# Patient Record
Sex: Male | Born: 1960 | Race: Black or African American | Hispanic: No | Marital: Married | Smoking: Never smoker
Health system: Southern US, Community
[De-identification: ages and names within clinical notes are randomized; demographics above are authoritative.]

## PROBLEM LIST (undated history)

## (undated) DIAGNOSIS — E538 Deficiency of other specified B group vitamins: Secondary | ICD-10-CM

## (undated) DIAGNOSIS — K219 Gastro-esophageal reflux disease without esophagitis: Secondary | ICD-10-CM

## (undated) DIAGNOSIS — J45909 Unspecified asthma, uncomplicated: Secondary | ICD-10-CM

## (undated) DIAGNOSIS — I10 Essential (primary) hypertension: Secondary | ICD-10-CM

## (undated) HISTORY — DX: Unspecified asthma, uncomplicated: J45.909

## (undated) HISTORY — PX: THROAT SURGERY: SHX803

## (undated) HISTORY — DX: Deficiency of other specified B group vitamins: E53.8

## (undated) HISTORY — DX: Gastro-esophageal reflux disease without esophagitis: K21.9

## (undated) HISTORY — DX: Essential (primary) hypertension: I10

---

## 2003-06-23 HISTORY — PX: OTHER SURGICAL HISTORY: SHX169

## 2008-02-28 ENCOUNTER — Emergency Department (HOSPITAL_COMMUNITY): Admission: EM | Admit: 2008-02-28 | Discharge: 2008-02-28 | Payer: Self-pay | Admitting: Emergency Medicine

## 2010-04-14 ENCOUNTER — Encounter: Payer: Self-pay | Admitting: Internal Medicine

## 2010-04-14 ENCOUNTER — Encounter: Admission: RE | Admit: 2010-04-14 | Discharge: 2010-04-14 | Payer: Self-pay | Admitting: Family Medicine

## 2010-05-21 ENCOUNTER — Ambulatory Visit: Payer: Self-pay | Admitting: Internal Medicine

## 2010-05-21 DIAGNOSIS — J4530 Mild persistent asthma, uncomplicated: Secondary | ICD-10-CM | POA: Insufficient documentation

## 2010-05-21 DIAGNOSIS — R05 Cough: Secondary | ICD-10-CM

## 2010-07-03 ENCOUNTER — Ambulatory Visit
Admission: RE | Admit: 2010-07-03 | Discharge: 2010-07-03 | Payer: Self-pay | Source: Home / Self Care | Attending: Internal Medicine | Admitting: Internal Medicine

## 2010-07-03 ENCOUNTER — Encounter: Payer: Self-pay | Admitting: Internal Medicine

## 2010-07-22 NOTE — Assessment & Plan Note (Signed)
Summary: Pulmonary/ new pt eval with vcd ? asthma also    Visit Type:  Initial Consult Copy to:  Dr. Leilani Able Primary Provider/Referring Provider:  Dr. Leilani Able  CC:  Cough.  History of Present Illness: 50 yowm never smoker with new onset doe x 2006   May 21, 2010  1st pulmonary office eval cc doe indolent  onset sob/ dry cough   [place] from [place] moved to [place] 2007 assoc with cough and congestion worse when lie down.  presently doe x heavy exertion like jogging.   ventolin helps the most  mucus is clear   Pt denies any significant sore throat, dysphagia, itching, sneezing,  nasal congestion or excess secretions,  fever, chills, sweats, unintended wt loss, pleuritic or exertional cp, hempoptysis, change in activity tolerance  orthopnea pnd or leg swelling Pt also denies any obvious fluctuation in symptoms with weather or environmental change or other alleviating or aggravating factors.       Preventive Screening-Counseling & Management  Alcohol-Tobacco     Passive Smoke Exposure: yes  Current Medications (verified): 1)  Ventolin Hfa 108 (90 Base) Mcg/act Aers (Albuterol Sulfate) .... 2 Puffs Every 4-6 Hrs As Needed  Allergies (verified): No Known Drug Allergies  Past History:  Past Medical History: ? 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  The patient denies shortness of breath with activity, coughing up blood, chest pain, irregular heartbeats,  acid heartburn, indigestion, loss of appetite, weight change, abdominal pain, difficulty swallowing, sore throat, tooth/dental problems, headaches, nasal congestion/difficulty breathing through nose, sneezing, itching, ear ache, anxiety, depression, hand/feet swelling, joint stiffness or pain, rash, change in color of mucus, and fever.    Vital Signs:  Patient profile:   50 year old male Height:      71 inches Weight:      304 pounds BMI:     42.55 O2 Sat:      95 % on Room air Temp:     98.2 degrees F oral Pulse rate:   87 / minute BP sitting:   138 / 90  (left arm) Cuff size:   large  Vitals Entered By: Vernie Murders (May 21, 2010 10:20 AM)  O2 Flow:  Room air  Physical Exam  Additional Exam:  wt 304 May 21, 2010 amb obese bm with classic pseudowheeze resolves with purse lip maneuver and mod hoarseness HEENT: nl dentition, turbinates, and orophanx. Nl external ear canals without cough reflex NECK :  without JVD/Nodes/TM/ nl carotid upstrokes bilaterally LUNGS: no acc muscle use, clear to A and P bilaterally without cough on insp or exp maneuvers CV:  RRR  no s3 or murmur or increase in P2, no edema  ABD:  soft and nontender with nl excursion in the supine position. No bruits or organomegaly, bowel sounds nl MS:  warm without deformities, calf tenderness, cyanosis or clubbing SKIN: warm and dry without lesions   NEURO:  alert, approp, no deficits     CXR  Procedure date:  04/14/2010  Findings:      No active disease   Impression & Recommendations:  Problem # 1:  COUGH (ICD-786.2)  Most c/w  Classic Upper airway cough syndrome, so named because it's frequently impossible to sort out how much is  CR/sinusitis with freq throat clearing (which can be related to primary GERD)   vs  causing  secondary extra esophageal GERD from wide swings in gastric pressure that occur with throat clearing, promoting self use of mint and menthol lozenges that reduce the lower  esophageal sphincter tone and exacerbate the problem further These are the same pts who not infrequently have failed to tolerate ace inhibitors,  dry powder inhalers or biphosphonates or report having reflux symptoms that don't respond to standard doses of PPI  For now try max gerd rx plus diet with full pft's plus f/v loop to r/o a mechanical upper airway problem (scarring from prev surgery)  on return     Problem # 2:  ASTHMA, UNSPECIFIED (ICD-493.90)   DDX of  difficult airways managment all start with A and  include Adherence, Ace Inhibitors, Acid Reflux, Active Sinus Disease, Alpha 1 Antitripsin deficiency, Anxiety masquerading as Airways dz,  ABPA,  allergy(esp in young), Aspiration (esp in elderly), Adverse effects of DPI,  Active smokers, plus one B  = Beta blocker use.Marland Kitchen   Unifying dx here is Acid reflux, see #1 but saba response does suggest this is at least partly an asthma issue.  In this case Adherence is the biggest issue and starts with  inability to use HFA effectively and also  understand that SABA treats the symptoms but doesn't get to the underlying problem (inflammation).  I used  the ananology of putting steroid cream on a rash to help explain the meaning of topical therapy and the need to get the drug to the target tissue.  Remains to be seen how much primary airways dz remains after correct the upper airway problem  Medications Added to Medication List This Visit: 1)  Nexium 40 Mg Cpdr (Esomeprazole magnesium) .... Take  one 30-60 min before first meal 2)  Pepcid 20 Mg Tabs (Famotidine) .... Take one by mouth at bedtime 3)  Dulera 100-5 Mcg/act Aero (Mometasone furo-formoterol fum) .... 2 puffs every 12 hours as needed for coughing or short of breath or wheezing 4)  Ventolin Hfa 108 (90 Base) Mcg/act Aers (Albuterol sulfate) .... 2 puffs every 4-6 hrs as needed 5)  Ventolin Hfa 108 (90 Base) Mcg/act Aers (Albuterol sulfate) .... 2 puffs every 4-6 hrs as needed for  emergencies  Other Orders: Consultation Level V (16109)  Patient Instructions: 1)  Please schedule a follow-up appointment in 4weeks, sooner if needed with PFT's  -first week in January is ok 2)  Dulera 100 2 puffs every 12 hours as needed 3)  start nexium 40 mg Take  one 30-60 min before first meal  and pepcid 20 mg at bedtime 4)  GERD (REFLUX)  is a common cause of respiratory symptoms. It commonly presents without heartburn and can be treated with medication, but also with lifestyle changes including avoidance of late meals, excessive alcohol, smoking cessation, and avoid fatty foods, chocolate, peppermint, colas, red wine, and acidic juices such as orange juice. NO MINT OR MENTHOL PRODUCTS SO NO COUGH DROPS  5)  USE SUGARLESS CANDY INSTEAD (jolley ranchers)  6)  NO OIL BASED VITAMINS  Prescriptions: NEXIUM 40 MG  CPDR (ESOMEPRAZOLE MAGNESIUM) Take  one 30-60 min before  first meal  #34 x 3   Entered and Authorized by:   Nyoka Cowden MD   Signed by:   Nyoka Cowden MD on 05/21/2010   Method used:   Electronically to        [facility] [phone]* (retail)       [address]       [place], [place]  [address]       Ph: [phone]       Fax: [phone]   RxID:   [id number] PEPCID 20 MG TABS (FAMOTIDINE) take one by mouth at bedtime  #34 x 3   Entered and Authorized by:   Nyoka Cowden MD   Signed by:   Nyoka Cowden MD on 05/21/2010   Method used:   Electronically to        [facility] [phone]* (retail)       [address]       [place], [place]  [address]       Ph: [phone]       Fax: [phone]   RxID:   [id number]

## 2010-07-24 NOTE — Assessment & Plan Note (Signed)
Summary: Pulmnonary/ f/u ov dx = asthma, hfa 90% effective   Copy to:  Dr. Leilani Able Primary Provider/Referring Provider:  Dr. Leilani Able  CC:  Rov after PFT.  History of Present Illness: 71 yowm never smoker with new onset doe x 2006   May 21, 2010  1st pulmonary office eval cc doe indolent  onset sob/ dry cough   [place] 2007 assoc with cough and congestion worse when lie down.  presently doe x heavy exertion like jogging.   ventolin helps the most  mucus is clear. rec Please schedule a follow-up appointment in 4weeks, sooner if needed with PFT's  -first week in January is ok Dulera 100 2 puffs every 12 hours as needed > lost it  start nexium 40 mg Take  one 30-60 min before first meal  and pepcid 20 mg at bedtime GERD (REFLUX)   diet   July 03, 2010 ov with pft's.with marked airflow  obstruction > normalizes p saba but pt having virtually no symptoms so rarely uses saba and no longer has dulera. no cough. Pt denies any significant sore throat, dysphagia, itching, sneezing,  nasal congestion or excess secretions,  fever, chills, sweats, unintended wt loss, pleuritic or exertional cp, hempoptysis, change in activity tolerance  orthopnea pnd or leg swelling Pt also denies any obvious fluctuation in symptoms with weather or environmental change or other alleviating or aggravating factors.     no nocturnal symptoms or noct need for saba.    Preventive Screening-Counseling & Management  Alcohol-Tobacco     Smoking Status: never     Passive Smoke Exposure: yes  Current Medications (verified): 1)  Nexium 40 Mg  Cpdr (Esomeprazole Magnesium) .... ***not Taking***take  One 30-60 Min Before First Meal 2)  Pepcid 20 Mg Tabs (Famotidine) .... Take One By Mouth At Bedtime 3)  Dulera 100-5 Mcg/act Aero (Mometasone Furo-Formoterol Fum) .... ***not Taking***2 Puffs Every 12 Hours As Needed For Coughing or Short of Breath or  Wheezing 4)  Ventolin Hfa 108 (90 Base) Mcg/act Aers (Albuterol Sulfate) .... ***not Taking***2 Puffs Every 4-6 Hrs As Needed For Emergencies 5)  Prilosec Otc 20 Mg Tbec (Omeprazole Magnesium) .Marland Kitchen.. 1 Tab By Mouth 30 Minutes Before First Meal of The Day  Allergies (verified): No Known Drug Allergies  Past History:  Past Medical History:   Ashtma........................................Marland KitchenWert       - hfa 75% May 21, 2010 > 90% July 03, 2010       - PFT's FEV1 1.86 (50%) ratop 57 > nl p B2 (68% better) wotj DLCO nl  Social History: Smoking Status:  never  Vital Signs:  Patient profile:   50 year old male Height:      71 inches Weight:      298 pounds BMI:     41.71 O2 Sat:      97 % on Room air Temp:     98.3 degrees F oral Pulse rate:   79 / minute BP sitting:   100 / 70  (left arm) Cuff size:   large  Vitals Entered By: Zackery Barefoot CMA (July 03, 2010 10:11 AM)  O2 Flow:  Room air CC: Rov after PFT Comments Medications reviewed with patient Verified contact number and pharmacy with patient Zackery Barefoot [facility]  July 03, 2010 10:12 AM    Physical Exam  Additional Exam:  wt 304 May 21, 2010 > 298 July 03, 2010  amb obese bm with classic pseudowheeze resolves with purse lip maneuver and mod hoarseness HEENT: nl dentition, turbinates, and orophanx. Nl external ear canals without cough reflex NECK :  without JVD/Nodes/TM/ nl carotid upstrokes bilaterally LUNGS: no acc muscle use, clear to A and P bilaterally without cough on insp or exp maneuvers CV:  RRR  no s3 or murmur or increase in P2, no edema  ABD:  soft and nontender with nl excursion in the supine position. No bruits or organomegaly, bowel sounds nl MS:  warm without deformities, calf tenderness, cyanosis or clubbing SKIN: warm and dry without lesions       Impression & Recommendations:  Problem # 1:  ASTHMA, UNSPECIFIED (ICD-493.90) I had an extended discussion with the patient today  lasting 15 to 20 minutes of a 25 minute visit on the following issues:  He has much more asthma than subjective symptoms suggest and really needs to be on a maint rx for at least 3 months before consider step down rx.  I spent extra time with the patient today explaining optimal mdi  technique.  This improved from  75-90% p coaching   Each maintenance medication was reviewed in detail including most importantly the difference between maintenance and as needed and under what circumstances the prns are to be used. See instructions for specific recommendations   Problem # 2:  COUGH (ICD-786.2)  resolved on rx for gerd > continue am dosing only of ppi plus diet, also reviewed again  Orders: Est. Patient Level IV (04540)  Medications Added to Medication List This Visit: 1)  Nexium 40 Mg Cpdr (Esomeprazole magnesium) .... ***not taking***take  one 30-60 min before first meal 2)  Dulera 100-5 Mcg/act Aero (Mometasone furo-formoterol fum) .... ***not taking***2 puffs every 12 hours as needed for coughing or short of breath or wheezing 3)  Dulera 100-5 Mcg/act Aero (Mometasone furo-formoterol fum) .... 2 puffs first thing  in am and 2 puffs again in pm about 12 hours later 4)  Ventolin Hfa 108 (90 Base) Mcg/act Aers (Albuterol sulfate) .... ***not taking***2 puffs every 4-6 hrs as needed for emergencies 5)  Prilosec Otc 20 Mg Tbec (Omeprazole magnesium) .Marland Kitchen.. 1 tab by mouth 30 minutes before first meal of the day 6)  Proair Hfa 108 (90 Base) Mcg/act Aers (Albuterol sulfate) .Marland Kitchen.. 1-2 puffs every 4-6 hours as needed  Patient Instructions: 1)  Stay on prilosec 20 mg Take  one 30-60 min before first meal of the day 2)  dulera 100 2 puffs first thing  in am and 2 puffs again in pm about 12 hours later  3)  Return to office in 3 months, sooner if needed  Prescriptions: PROAIR HFA 108 (90 BASE) MCG/ACT  AERS (ALBUTEROL SULFATE) 1-2 puffs every 4-6 hours as needed  #1 x 1   Entered and Authorized by:    Nyoka Cowden MD   Signed by:   Nyoka Cowden MD on 07/03/2010   Method used:   Print then Give to Patient   RxID:   [id number] DULERA 100-5 MCG/ACT AERO (MOMETASONE FURO-FORMOTEROL FUM) 2 puffs first thing  in am and 2 puffs again in pm about 12 hours later  #1 x 11   Entered and Authorized by:   Nyoka Cowden MD   Signed by:   Nyoka Cowden MD on 07/03/2010   Method used:   Print then Give to Patient   RxID:   [id number]

## 2010-07-24 NOTE — Miscellaneous (Signed)
Summary: Orders Update pft charges  Clinical Lists Changes  Orders: Added new Service order of Carbon Monoxide diffusing w/capacity (94720) - Signed Added new Service order of Lung Volumes (94240) - Signed Added new Service order of Spirometry (Pre & Post) (94060) - Signed 

## 2010-10-15 ENCOUNTER — Other Ambulatory Visit: Payer: Self-pay | Admitting: *Deleted

## 2010-10-15 MED ORDER — ESOMEPRAZOLE MAGNESIUM 40 MG PO CPDR: 40.0000 mg | DELAYED_RELEASE_CAPSULE | Freq: Every day | ORAL | Status: DC

## 2011-08-05 ENCOUNTER — Other Ambulatory Visit: Payer: Self-pay | Admitting: Internal Medicine

## 2011-08-27 ENCOUNTER — Telehealth: Payer: Self-pay | Admitting: Internal Medicine

## 2011-08-27 MED ORDER — MOMETASONE FURO-FORMOTEROL FUM 100-5 MCG/ACT IN AERO: 2.0000 | INHALATION_SPRAY | Freq: Two times a day (BID) | RESPIRATORY_TRACT | Status: DC

## 2011-08-27 NOTE — Telephone Encounter (Signed)
Contacted patient. Notified pt that he had not been seen since 06/2010 and needed an appt. I scheduled pt an appt with MW and sent in 1 refill. Also notified him to keep his upcoming appt if he wants more refills.

## 2011-08-28 ENCOUNTER — Telehealth: Payer: Self-pay | Admitting: Internal Medicine

## 2011-08-28 NOTE — Telephone Encounter (Signed)
Spoke with pt pharmacy told him its non formulary for him ,  Pt states he's had it filled before. He going to contact  His insurance company and see why they are not wanting To cover it. Has an appt with you this month will leave him a  Sample of the dulera 100 mcg pt to pick up today .

## 2011-09-08 ENCOUNTER — Ambulatory Visit: Payer: Self-pay | Admitting: Internal Medicine

## 2011-09-25 ENCOUNTER — Encounter: Payer: Self-pay | Admitting: Pulmonary Disease

## 2011-09-28 ENCOUNTER — Encounter: Payer: Self-pay | Admitting: Internal Medicine

## 2011-09-28 ENCOUNTER — Ambulatory Visit (INDEPENDENT_AMBULATORY_CARE_PROVIDER_SITE_OTHER): Payer: BC Managed Care – PPO | Admitting: Internal Medicine

## 2011-09-28 VITALS — BP 136/82 | HR 63 | Temp 98.3°F | Ht 70.0 in | Wt 307.0 lb

## 2011-09-28 DIAGNOSIS — J45909 Unspecified asthma, uncomplicated: Secondary | ICD-10-CM

## 2011-09-28 MED ORDER — ESOMEPRAZOLE MAGNESIUM 40 MG PO CPDR: 40.0000 mg | DELAYED_RELEASE_CAPSULE | Freq: Every day | ORAL | Status: DC

## 2011-09-28 MED ORDER — FAMOTIDINE 20 MG PO TABS: ORAL_TABLET | ORAL | Status: AC

## 2011-09-28 NOTE — Progress Notes (Signed)
Subjective:     Patient ID: Christopher Steele, male   DOB: [birth year]    MRN: [id number]  HPI 33 yowm never smoker with new onset doe x 2006   May 21, 2010 1st pulmonary office eval cc doe indolent onset sob/ dry cough [place] assoc with cough and congestion worse when lie down. presently doe x heavy exertion like jogging. ventolin helps the most mucus is clear  Rec Please schedule a follow-up appointment in 4weeks, sooner if needed with PFT's -first week in January is ok  2) Dulera 100 2 puffs every 12 hours as needed  3) start nexium 40 mg Take one 30-60 min before first meal and pepcid 20 mg at bedtime  4) GERD diet    09/28/2011 f/u ov/Caroly Purewal ran out of dulera x one month, prior to that doing great despite only using the dulera maybe once or twice daily, and not needing saba much at all daytime. Admits not adhering to diet or gerd rx either and more sob vs p last ov but no excess cough/ sputum production or overt sinus or reflux symptoms  Sleeping ok without nocturnal  or early am exacerbation  of respiratory  c/o's or need for noct saba. Also denies any obvious fluctuation of symptoms with weather or environmental changes or other aggravating or alleviating factors except as outlined above   ROS  At present neg for  any significant sore throat, dysphagia, dental problems, itching, sneezing,  nasal congestion or excess/ purulent secretions, ear ache,   fever, chills, sweats, unintended wt loss, pleuritic or exertional cp, hemoptysis, palpitations, orthopnea pnd or leg swelling.  Also denies presyncope, palpitations, heartburn, abdominal pain, anorexia, nausea, vomiting, diarrhea  or change in bowel or urinary habits, change in stools or urine, dysuria,hematuria,  rash, arthralgias, visual complaints, headache, numbness weakness or ataxia or problems with walking or coordination. No noted change in mood/affect or memory.                      Past Medical History:  ? 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 Nl external ear canals without cough reflex  NECK : without JVD/Nodes/TM/ nl carotid upstrokes bilaterally  LUNGS: no acc muscle use, clear to A and P bilaterally without cough on insp or exp maneuvers  CV: RRR no s3 or murmur or increase in P2, no edema  ABD: soft and nontender with nl excursion in the supine position. No bruits or organomegaly, bowel sounds nl  MS: warm without deformities, calf tenderness, cyanosis or clubbing  SKIN: warm and dry without lesions  NEURO: alert, approp, no deficits Assessment:        Plan:

## 2011-09-28 NOTE — Patient Instructions (Signed)
Stay on nexium 40mg  x one pill 30 min before your first meal and pecid 20 mg one at bedtime until return  GERD (REFLUX)  is an extremely common cause of respiratory symptoms just like yours, many times with no significant heartburn at all.    It can be treated with medication, but also with lifestyle changes including avoidance of late meals, excessive alcohol, smoking cessation, and avoid fatty foods, chocolate, peppermint, colas, red wine, and acidic juices such as orange juice.  NO MINT OR MENTHOL PRODUCTS SO NO COUGH DROPS  USE SUGARLESS CANDY INSTEAD (jolley ranchers or Stover's)  NO OIL BASED VITAMINS - use powdered substitutes.  Only use dulera 100 2 puffs every 12 hours if you're still having breathing problem   Please schedule a follow up office visit in 4 weeks, sooner if needed with PFT's

## 2011-10-01 NOTE — Assessment & Plan Note (Signed)
Not clear how much of this is truly asthma vs  Classic Upper airway cough syndrome, so named because it's frequently impossible to sort out how much is  CR/sinusitis with freq throat clearing (which can be related to primary GERD)   vs  causing  secondary (" extra esophageal")  GERD from wide swings in gastric pressure that occur with throat clearing, often  promoting self use of mint and menthol lozenges that reduce the lower esophageal sphincter tone and exacerbate the problem further in a cyclical fashion.   These are the same pts (now being labeled as having "irritable larynx syndrome" by some cough centers) who not infrequently have a history of having failed to tolerate ace inhibitors,  dry powder inhalers or biphosphonates or report having atypical reflux symptoms that don't respond to standard doses of PPI , and are easily confused as having aecopd or asthma flares by even experienced allergists/ pulmonologists.   For now would like him to max rx with gerd and only use dulera if / when he still has symptoms and return for pft's to sort out various sources of sob, much of which may be directly attributable to wt gain/ gerd.   The proper method of use, as well as anticipated side effects, of a metered-dose inhaler are discussed and demonstrated to the patient. Improved effectiveness after extensive coaching during this visit to a level of approximately  75%

## 2011-10-28 ENCOUNTER — Ambulatory Visit: Payer: BC Managed Care – PPO | Admitting: Internal Medicine

## 2011-11-27 ENCOUNTER — Telehealth: Payer: Self-pay | Admitting: Internal Medicine

## 2011-11-27 MED ORDER — MOMETASONE FURO-FORMOTEROL FUM 100-5 MCG/ACT IN AERO: 2.0000 | INHALATION_SPRAY | Freq: Two times a day (BID) | RESPIRATORY_TRACT | Status: DC

## 2011-11-27 NOTE — Telephone Encounter (Signed)
Spoke with pt's spouse. She states that the pt is needing a refill on dulera. I advised will refill x 1 only, but he needs to schedule rov with PFT's, he cancelled the last appt. She states he is out of town due to an emergency and when he returns she will have him call for appt. Dulera refilled, nothing further needed.

## 2011-12-04 ENCOUNTER — Ambulatory Visit: Payer: BC Managed Care – PPO | Admitting: Internal Medicine

## 2012-07-10 ENCOUNTER — Other Ambulatory Visit: Payer: Self-pay | Admitting: Internal Medicine

## 2012-09-02 ENCOUNTER — Other Ambulatory Visit: Payer: Self-pay | Admitting: Internal Medicine

## 2012-09-03 ENCOUNTER — Telehealth: Payer: Self-pay | Admitting: Pulmonary Disease

## 2012-09-03 NOTE — Telephone Encounter (Signed)
Patient called wanting a refill on his inhaler that has expired.  Has not followed up with [place] compliantly on 2 different occasions, despite having apptm 4 weeks both times.  Has not been seen in almost a year.  Having issues with cough and some increased sob.  I have informed pt that we cannot fill his inhaler due to noncompliance with visits and [place] not participating in his care, and will need to re-establish with [place].  Recommended that he go to urgent care if he is sick and having breathing issues. To call the office Monday for apptm.

## 2012-09-05 ENCOUNTER — Emergency Department (INDEPENDENT_AMBULATORY_CARE_PROVIDER_SITE_OTHER)
Admission: EM | Admit: 2012-09-05 | Discharge: 2012-09-05 | Disposition: A | Discharge status: Home / Self Care | Payer: BC Managed Care – PPO | Source: Home / Self Care | Attending: Emergency Medicine | Admitting: Emergency Medicine

## 2012-09-05 ENCOUNTER — Encounter (HOSPITAL_COMMUNITY): Payer: Self-pay | Admitting: Emergency Medicine

## 2012-09-05 DIAGNOSIS — J45909 Unspecified asthma, uncomplicated: Secondary | ICD-10-CM

## 2012-09-05 MED ORDER — ALBUTEROL SULFATE HFA 108 (90 BASE) MCG/ACT IN AERS: 2.0000 | INHALATION_SPRAY | RESPIRATORY_TRACT | Status: DC | PRN

## 2012-09-05 MED ORDER — ESOMEPRAZOLE MAGNESIUM 40 MG PO CPDR: 40.0000 mg | DELAYED_RELEASE_CAPSULE | Freq: Every day | ORAL | Status: DC

## 2012-09-05 MED ORDER — MOMETASONE FURO-FORMOTEROL FUM 100-5 MCG/ACT IN AERO: 2.0000 | INHALATION_SPRAY | Freq: Two times a day (BID) | RESPIRATORY_TRACT | Status: DC

## 2012-09-05 NOTE — ED Notes (Addendum)
Pt is here needing refill on his medications Had SOB earlier this am but it has resovled Denies any new medical problems  He is alert and oriented w/no signs of acute respiratory distress.

## 2012-09-05 NOTE — ED Provider Notes (Signed)
History     CSN: [id number]  Arrival date & time 09/05/12  1120   First MD Initiated Contact with Patient 09/05/12 1127      No chief complaint on file.   (Consider location/radiation/quality/duration/timing/severity/associated sxs/prior treatment) Patient is a 52 y.o. male presenting with cough. The history is provided by the patient. No language interpreter was used.  Cough Cough characteristics:  Non-productive Severity:  Mild Pt has a history of asthma.   Pt reports he is out of dulera, nexium and albuterol  Past Medical History  Diagnosis Date  . Asthma     hfa 75% May 21, 2010 > 90% 07/03/10; PFT's FET1 1.86 (50%) ratop 57 > nl p B2 ( 68% better) wotj DLCO nl    Past Surgical History  Procedure Laterality Date  . Polyp on vocal cord removed  2005    Family History  Problem Relation Age of Onset  . Asthma Mother   . Heart disease Father     History  Substance Use Topics  . Smoking status: Never Smoker   . Smokeless tobacco: Not on file  . Alcohol Use: Not on file      Review of Systems  Respiratory: Positive for cough.   All other systems reviewed and are negative.    Allergies  Review of patient's allergies indicates no known allergies.  Home Medications   Current Outpatient Rx  Name  Route  Sig  Dispense  Refill  . albuterol (PROVENTIL HFA;VENTOLIN HFA) 108 (90 BASE) MCG/ACT inhaler   Inhalation   Inhale 2 puffs into the lungs every 4 (four) hours as needed.         Marland Kitchen esomeprazole (NEXIUM) 40 MG capsule   Oral   Take 1 capsule (40 mg total) by mouth daily.   30 capsule   11   . famotidine (PEPCID) 20 MG tablet      One at bedtime   30 tablet   11   . mometasone-formoterol (DULERA) 100-5 MCG/ACT AERO   Inhalation   Inhale 2 puffs into the lungs 2 (two) times daily.   1 Inhaler   0     Needs appt for refills     There were no vitals taken for this visit.  Physical Exam  Nursing note and vitals reviewed. Constitutional:  He appears well-developed and well-nourished.  HENT:  Head: Normocephalic.  Cardiovascular: Normal rate and normal heart sounds.   Pulmonary/Chest: Effort normal.  Musculoskeletal: Normal range of motion.  Neurological: He is alert.  Psychiatric: He has a normal mood and affect.    ED Course  Procedures (including critical care time)  Labs Reviewed - No data to display No results found.   1. Asthma   2. Unspecified asthma       MDM  Pt given rx for dulera, nexium and albuterol       Elson Areas, PA-C 09/05/12 [address] [place], [place] 09/05/12 1333

## 2012-09-05 NOTE — ED Provider Notes (Signed)
Medical screening examination/treatment/procedure(s) were performed by non-physician practitioner and as supervising physician I was immediately available for consultation/collaboration.  Desean Heemstra, M.D.  Yosgar Demirjian C Jesica Goheen, MD 09/05/12 2107 

## 2012-11-04 ENCOUNTER — Ambulatory Visit: Payer: BC Managed Care – PPO | Admitting: Internal Medicine

## 2012-11-30 ENCOUNTER — Ambulatory Visit: Payer: BC Managed Care – PPO | Admitting: Internal Medicine

## 2013-05-13 ENCOUNTER — Encounter (HOSPITAL_COMMUNITY): Payer: Self-pay | Admitting: Emergency Medicine

## 2013-05-13 ENCOUNTER — Emergency Department (INDEPENDENT_AMBULATORY_CARE_PROVIDER_SITE_OTHER)
Admission: EM | Admit: 2013-05-13 | Discharge: 2013-05-13 | Disposition: A | Discharge status: Home / Self Care | Payer: BC Managed Care – PPO | Source: Home / Self Care | Attending: Family Medicine | Admitting: Family Medicine

## 2013-05-13 DIAGNOSIS — J45901 Unspecified asthma with (acute) exacerbation: Secondary | ICD-10-CM

## 2013-05-13 DIAGNOSIS — J45909 Unspecified asthma, uncomplicated: Secondary | ICD-10-CM

## 2013-05-13 MED ORDER — PREDNISONE 50 MG PO TABS: 50.0000 mg | ORAL_TABLET | Freq: Every day | ORAL | Status: DC

## 2013-05-13 MED ORDER — ALBUTEROL SULFATE (5 MG/ML) 0.5% IN NEBU
INHALATION_SOLUTION | RESPIRATORY_TRACT | Status: AC
  Filled 2013-05-13: qty 1

## 2013-05-13 MED ORDER — ALBUTEROL SULFATE HFA 108 (90 BASE) MCG/ACT IN AERS: 2.0000 | INHALATION_SPRAY | RESPIRATORY_TRACT | Status: DC | PRN

## 2013-05-13 MED ORDER — MOMETASONE FURO-FORMOTEROL FUM 100-5 MCG/ACT IN AERO: 2.0000 | INHALATION_SPRAY | Freq: Two times a day (BID) | RESPIRATORY_TRACT | Status: DC

## 2013-05-13 MED ORDER — IPRATROPIUM BROMIDE 0.02 % IN SOLN
0.5000 mg | Freq: Once | RESPIRATORY_TRACT | Status: AC
  Administered 2013-05-13: 0.5 mg via RESPIRATORY_TRACT

## 2013-05-13 MED ORDER — ESOMEPRAZOLE MAGNESIUM 40 MG PO CPDR: 40.0000 mg | DELAYED_RELEASE_CAPSULE | Freq: Every day | ORAL | Status: DC

## 2013-05-13 MED ORDER — ALBUTEROL SULFATE (5 MG/ML) 0.5% IN NEBU
5.0000 mg | INHALATION_SOLUTION | Freq: Once | RESPIRATORY_TRACT | Status: AC
  Administered 2013-05-13: 5 mg via RESPIRATORY_TRACT

## 2013-05-13 MED ORDER — SODIUM CHLORIDE 0.9 % IN NEBU
INHALATION_SOLUTION | RESPIRATORY_TRACT | Status: AC
  Filled 2013-05-13: qty 3

## 2013-05-13 MED ORDER — IPRATROPIUM BROMIDE 0.02 % IN SOLN
RESPIRATORY_TRACT | Status: AC
  Filled 2013-05-13: qty 2.5

## 2013-05-13 NOTE — ED Provider Notes (Signed)
Seon Gaertner is a 52 y.o. male who presents to Urgent Care today for wheezing and shortness of breath. Patient has a history of asthma. This was previously controlled with Dulera and intermittent albuterol. He has been out of these medications for about one month. Several days ago he noted increased productive cough and wheezing. He denies any chest pains or palpitations nausea vomiting or diarrhea. He would like his medications refilled and will followup with his primary care doctor soon.   Past Medical History  Diagnosis Date  . Asthma     hfa 75% May 21, 2010 > 90% 07/03/10; PFT's FET1 1.86 (50%) ratop 57 > nl p B2 ( 68% better) wotj DLCO nl   History  Substance Use Topics  . Smoking status: Never Smoker   . Smokeless tobacco: Not on file  . Alcohol Use: Yes   ROS as above Medications reviewed. No current facility-administered medications for this encounter.   Current Outpatient Prescriptions  Medication Sig Dispense Refill  . albuterol (PROVENTIL HFA;VENTOLIN HFA) 108 (90 BASE) MCG/ACT inhaler Inhale 2 puffs into the lungs every 4 (four) hours as needed.  1 Inhaler  1  . esomeprazole (NEXIUM) 40 MG capsule Take 1 capsule (40 mg total) by mouth daily.  30 capsule  1  . mometasone-formoterol (DULERA) 100-5 MCG/ACT AERO Inhale 2 puffs into the lungs 2 (two) times daily.  1 Inhaler  1  . predniSONE (DELTASONE) 50 MG tablet Take 1 tablet (50 mg total) by mouth daily.  5 tablet  0    Exam:  BP 125/75  Pulse 82  Temp(Src) 98.2 F (36.8 C) (Oral)  Resp 18  SpO2 100% Gen: Well NAD, obese HEENT: EOMI,  MMM Lungs: Normal work of breathing. Wheezing present left lung with expiration Heart: RRR no MRG Abd: NABS, Soft. NT, ND Exts: Non edematous BL  LE, warm and well perfused.   Patient was given DuoNeb nebulizer treatment and had considerable improvement in symptoms.   Assessment and Plan: 52 y.o. male with asthma exacerbation. Currently well controlled with nebulizer  treatment. We'll treat with albuterol, and inhaled corticosteroid, prednisone, and Nexium. Followup with primary care provider or pulmonologist. Discussed warning signs or symptoms. Please see discharge instructions. Patient expresses understanding.      Rodolph Bong, MD 05/13/13 [phone]

## 2013-05-13 NOTE — ED Notes (Signed)
Pt here for refill on Medications. He needs his inhaler and dulera. Does feel SOB. Pt is alert and oriented.

## 2015-04-24 ENCOUNTER — Emergency Department (HOSPITAL_COMMUNITY): Payer: BLUE CROSS/BLUE SHIELD

## 2015-04-24 ENCOUNTER — Encounter (HOSPITAL_COMMUNITY): Payer: Self-pay | Admitting: Emergency Medicine

## 2015-04-24 ENCOUNTER — Emergency Department (HOSPITAL_COMMUNITY)
Admission: EM | Admit: 2015-04-24 | Discharge: 2015-04-24 | Disposition: A | Discharge status: Home / Self Care | Payer: BLUE CROSS/BLUE SHIELD | Attending: Emergency Medicine | Admitting: Emergency Medicine

## 2015-04-24 DIAGNOSIS — Z79899 Other long term (current) drug therapy: Secondary | ICD-10-CM | POA: Insufficient documentation

## 2015-04-24 DIAGNOSIS — J45901 Unspecified asthma with (acute) exacerbation: Secondary | ICD-10-CM | POA: Insufficient documentation

## 2015-04-24 DIAGNOSIS — R0602 Shortness of breath: Secondary | ICD-10-CM | POA: Diagnosis present

## 2015-04-24 LAB — CBC
HCT: 39.3 % (ref 39.0–52.0)
Hemoglobin: 12.8 g/dL — ABNORMAL LOW (ref 13.0–17.0)
MCH: 31.1 pg (ref 26.0–34.0)
MCHC: 32.6 g/dL (ref 30.0–36.0)
MCV: 95.6 fL (ref 78.0–100.0)
PLATELETS: 247 10*3/uL (ref 150–400)
RBC: 4.11 MIL/uL — ABNORMAL LOW (ref 4.22–5.81)
RDW: 12.2 % (ref 11.5–15.5)
WBC: 6.2 10*3/uL (ref 4.0–10.5)

## 2015-04-24 LAB — BASIC METABOLIC PANEL
Anion gap: 13 (ref 5–15)
BUN: 15 mg/dL (ref 6–20)
CALCIUM: 9.1 mg/dL (ref 8.9–10.3)
CO2: 25 mmol/L (ref 22–32)
CREATININE: 1.34 mg/dL — AB (ref 0.61–1.24)
Chloride: 103 mmol/L (ref 101–111)
GFR calc Af Amer: >60 mL/min (ref >60)
GFR, EST NON AFRICAN AMERICAN: 59 mL/min — AB (ref >60)
GLUCOSE: 150 mg/dL — AB (ref 65–99)
Potassium: 3.8 mmol/L (ref 3.5–5.1)
SODIUM: 141 mmol/L (ref 135–145)

## 2015-04-24 LAB — I-STAT TROPONIN, ED: Troponin i, poc: 0.01 ng/mL (ref 0.00–0.08)

## 2015-04-24 MED ORDER — ALBUTEROL SULFATE HFA 108 (90 BASE) MCG/ACT IN AERS
2.0000 | INHALATION_SPRAY | RESPIRATORY_TRACT | Status: DC | PRN
  Administered 2015-04-24: 2 via RESPIRATORY_TRACT

## 2015-04-24 MED ORDER — ALBUTEROL SULFATE HFA 108 (90 BASE) MCG/ACT IN AERS
INHALATION_SPRAY | RESPIRATORY_TRACT | Status: AC
  Filled 2015-04-24: qty 6.7

## 2015-04-24 MED ORDER — PREDNISONE 50 MG PO TABS: 50.0000 mg | ORAL_TABLET | Freq: Every day | ORAL | Status: DC

## 2015-04-24 MED ORDER — ALBUTEROL SULFATE (2.5 MG/3ML) 0.083% IN NEBU
5.0000 mg | INHALATION_SOLUTION | Freq: Once | RESPIRATORY_TRACT | Status: AC
  Administered 2015-04-24: 5 mg via RESPIRATORY_TRACT
  Filled 2015-04-24: qty 6

## 2015-04-24 MED ORDER — PREDNISONE 20 MG PO TABS
60.0000 mg | ORAL_TABLET | Freq: Once | ORAL | Status: AC
  Administered 2015-04-24: 60 mg via ORAL
  Filled 2015-04-24: qty 3

## 2015-04-24 NOTE — ED Notes (Signed)
Patient verbalized understanding of D/C instructions and prescription medication. Denies any further questions at this time.

## 2015-04-24 NOTE — ED Notes (Signed)
MD at bedside. 

## 2015-04-24 NOTE — ED Notes (Signed)
Patient transported to X-ray 

## 2015-04-24 NOTE — ED Notes (Signed)
Patient c/o increased trouble breathing, wheezing, and cough.  Patient has hx of asthma and has had cough for 2 days.  Patient also reports chest tightness, but denies pain.  States he works 3rd shift and started the wheezing this morning while at work.  Patient A&O x 4.

## 2015-04-24 NOTE — Discharge Instructions (Signed)
Return to the ED with any concerns including difficulty breathing despite using albuterol every 4 hours, not drinking fluids, decreased urine output, vomiting and not able to keep down liquids or medications, decreased level of alertness/lethargy, or any other alarming symptoms    Emergency Department Resource Guide 1) Find a Doctor and Pay Out of Pocket Although you won't have to find out who is covered by your insurance plan, it is a good idea to ask around and get recommendations. You will then need to call the office and see if the doctor you have chosen will accept you as a new patient and what types of options they offer for patients who are self-pay. Some doctors offer discounts or will set up payment plans for their patients who do not have insurance, but you will need to ask so you aren't surprised when you get to your appointment.  2) Contact Your Local Health Department Not all health departments have doctors that can see patients for sick visits, but many do, so it is worth a call to see if yours does. If you don't know where your local health department is, you can check in your phone book. The CDC also has a tool to help you locate your state's health department, and many state websites also have listings of all of their local health departments.  3) Find a [facility] If your illness is not likely to be very severe or complicated, you may want to try a walk in clinic. These are popping up all over the country in pharmacies, drugstores, and shopping centers. They're usually staffed by nurse practitioners or physician assistants that have been trained to treat common illnesses and complaints. They're usually fairly quick and inexpensive. However, if you have serious medical issues or chronic medical problems, these are probably not your best option.  No Primary Care Doctor: - Call Health Connect at  [phone] - they can help you locate a primary care doctor that  accepts your insurance,  provides certain services, etc. - Physician Referral Service- [phone]  Chronic Pain Problems: Organization         Address  Phone   Notes  [facility]  [phone] Patients need to be referred by their primary care doctor.   Medication Assistance: Organization         Address  Phone   Notes  [facility] Medication [facility] [address] [place], [place] [address] [phone] --Must be a resident of [facility] -- Must have NO insurance coverage whatsoever (no Medicaid/ Medicare, etc.) -- The pt. MUST have a primary care doctor that directs their care regularly and follows them in the community   Med[facility]ist  [phone]   [employer]  [phone]    Agencies that provide inexpensive medical care: Organization         Address  Phone   Notes  [place]  [phone]   [facility]    [phone]   [facility] [place], [place] [address] [phone]   [place] [address] [place] [phone]   Planned Parenthood    [phone]   [facility]    [phone]   [place] and [place] [address], [place] Phone:  [phone], Fax:  [phone] Hours of Operation:  9 am - 6 pm, M-F.  Also accepts  Medicaid/Medicare and self-pay.  [facility] for [place] [address], [address], [place] Phone: [phone], Fax: [phone]. Hours of Operation:  8:30 am - 5:30 pm, M-F.  Also accepts Medicaid and self-pay.  [facility] [address], [place] Phone: [phone]   [place], [place], [place] [phone], Ext. [phone] Mondays & Thursdays: 7-9 AM.  First 15 patients are seen on a first come, first serve basis.    [place] Providers:  Organization         Address  Phone    Notes  [facility] [address], [place] [phone] Also accepts self-pay patients.  [facility] [address], [place]  [phone]   [place], [address], [place] [phone]   [facility] [address], [place] [phone]   Lucianne Lei [address], [place]   [phone] Only accepts [place] Access [place] patients after they have their name applied to their card.   Self-Pay (no insurance) in [facility]:  Organization         Address  Phone   Notes  Sickle Cell Patients, [facility] [place] [phone]   [facility] [place] [phone]   [facility] [place]  [place], [place], [place] [phone]   [facility]/Dr. Osei-Bonsu  [address] [place] or [address], [place] [phone] Phone number for both [place] and [place] locations is the same.  Urgent Medical and [facility] [address], [place] [phone]   [facility] [address], [place] or [address] [phone]   [facility] [address] [place] [phone], phone; [phone], fax Sees patients 1st and 3rd Saturday of every month.  Must not qualify for public or private insurance (i.e. Medicaid, Medicare, [place] Health Choice, Veterans' Benefits)  Household income should be no more than 200% of the poverty level The clinic cannot treat you if you are pregnant or think you are pregnant  Sexually transmitted diseases are not treated at the clinic.    Dental Care: Organization         Address  Phone  Notes  [facility] Department of [facility] [phone] Accepts children up to age 28 who are enrolled in [place] or [place]; pregnant women with a Medicaid card; and children who have applied for Medicaid or [place] Health Choice, but were declined, whose parents can pay a reduced fee at time of service.  [facility] Department of [facility]  [address], [place] [phone] Accepts children up to age 30 who are enrolled in [place] or [place]; pregnant women with a Medicaid card; and children who have applied for Medicaid or [place] Health Choice, but were declined, whose parents can pay a reduced fee at time of service.  [place] Adult [facility] Access PROGRAM  [place] [phone] Patients are seen by appointment only. Walk-ins are not accepted. [place] will see patients 57 years of age and older. Monday - Tuesday (8am-5pm) Most  Wednesdays (8:30-5pm) $30 per visit, cash only  [facility] Adult Dental Access PROGRAM  [address], [facility] [phone] Patients are seen by appointment only. Walk-ins are not accepted. [place] will see patients 71 years of age and older. One Wednesday Evening (Monthly: Volunteer Based).  $30 per visit, cash only  [place]  [phone] for adults; Children under age 88, call Graduate Pediatric Dentistry at [phone]. Children aged 35-14, please call [phone] to request a pediatric application.  Dental services are provided in all areas of dental care including fillings, crowns and bridges, complete and partial dentures, implants, gum treatment, root canals, and extractions. Preventive care is also provided. Treatment is provided to both adults and children. Patients are selected via a lottery and there is often a waiting list.   [facility] [address] [place]  [phone] [email or web address]   [facility] [address] [place], [place] [phone], Ext.  [phone] Second and Fourth Thursday of each month, opens at 6:30 AM; Clinic ends at 9 AM.  Patients are seen on a first-come first-served basis, and a limited number are seen during each clinic.   [facility]  [address] Hillard Danker [place], [place] [phone]   Eligibility Requirements You must have lived in [place], [place], or [place] counties for at least the last three months.   You cannot be eligible for state or federal sponsored [employer], including [employer], [place], or [employer].   You generally cannot be eligible for healthcare insurance through your employer.    How to apply: Eligibility screenings are held every Tuesday and Wednesday afternoon from 1:00 pm until 4:00 pm. You do not need an appointment for the interview!  [facility] [address] [place] [address], [place], [place]   [place]  [place] Department  [place]  [phone]    Behavioral Health Resources in the Community: Intensive Outpatient Programs Organization         Address  Phone  Notes  [place]. [address], [place], [place] [phone]   [facility] Outpatient [address], [place], [place]   ADS: Alcohol & Drug Svcs [address] [place], [place]   [place] [address],  [place], [place] or [phone]   Substance Abuse Resources Organization         Address  Phone  Notes  Alcohol and Drug Services  [phone]   [place]  [phone]   [facility] [place]   Chinita Pester  [phone]   Residential & Outpatient Substance Abuse Program  [phone]   Psychological Services Organization         Address  Phone  Notes  [facility] [place]  [place]  [phone]   [place] [address] [place] or [phone]    Mobile Crisis Teams Organization         Address  Phone  Notes  [facility], Mobile Crisis Care Unit  [phone]   [facility] Psychotherapeutic Services  [address] [place], [place]   [place] Levels [address] [place] [phone]    Self-Help/Support Groups Organization         Address  Phone  Notes  Mental Health Assoc. of [place] - variety of support groups  [place] Call for more information  Narcotics Anonymous (NA), [facility] [address], [employer] [place]  2 meetings at this location   Special educational needs teacher         Address  Phone  Notes  ASAP Residential Treatment [place],    [place]  [phone]   [facility]  [address] [place] [address], [place], [place]   [place], [place] [phone] Admissions: 8am-3pm M-F  Incentives Substance [place] [address],    [place], [place] [phone]   [facility] [address] [place], [place], [place]   [facility] [address],  [address], [place]   Insight Programs - Intensive Outpatient [address] Kristeen [address], [place], [place]   [facility] [facility] [address]  [place], [place] [phone] or [phone]   [facility] Treatment Services (RTS) [address] [place], [place] Accepts Medicaid  [facility] [place] [address],  [place] [phone] Substance Abuse/Addiction Treatment   [facility] Organization         Address  Phone  Notes  [facility]  [phone]   Domenic Schwab, PhD [address] [place], [place]   [phone] or [phone]   [place]  [place], [place] [phone]   [facility] [address] [place], [place] [phone] Insurance/Medicaid/sponsorship through [facility] and Families [address]                                    [place], [place] [phone] [place] [address], [place] [phone]    Dr. Adele Schilder  [phone]   [facility] Dept. 1) [address], [place] 2) [place] 3)  [place] [address], [place] [phone]  [phone]   [place] [phone] or [phone] (After Hours)

## 2015-04-24 NOTE — ED Provider Notes (Signed)
CSN: [id number]     Arrival date & time 04/24/15  0347 History   First MD Initiated Contact with Patient 04/24/15 0402     Chief Complaint  Patient presents with  . Shortness of Breath     (Consider location/radiation/quality/duration/timing/severity/associated sxs/prior Treatment) HPI  Pt with hx of asthma presenting with c/o wheezing and shortness of breath.  He states symptoms have been ongoing for the past several days.  He has been using albuterol- used it today approx 5-6 times.  No fever/chills.  Has had some nonproductive cough as well.  Pt states wheezing was getting worse while at work prior to coming to the ED.  No chest pain.  No leg swelling.  He denies hx of hospitalization or intubation for asthma.  No recent steroids.  There are no other associated systemic symptoms, there are no other alleviating or modifying factors.   Past Medical History  Diagnosis Date  . Asthma     hfa 75% May 21, 2010 > 90% 07/03/10; PFT's FET1 1.86 (50%) ratop 57 > nl p B2 ( 68% better) wotj DLCO nl   Past Surgical History  Procedure Laterality Date  . 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 Asthma Mother   . Heart disease Father    Social History  Substance Use Topics  . Smoking status: Never Smoker   . Smokeless tobacco: None  . Alcohol Use: Yes    Review of Systems  ROS reviewed and all otherwise negative except for mentioned in HPI    Allergies  Review of patient's allergies indicates no known allergies.  Home Medications   Prior to Admission medications   Medication Sig Start Date End Date Taking? Authorizing Provider  albuterol (PROVENTIL HFA;VENTOLIN HFA) 108 (90 BASE) MCG/ACT inhaler Inhale 2 puffs into the lungs every 4 (four) hours as needed. Patient taking differently: Inhale 2 puffs into the lungs every 4 (four) hours as needed for wheezing.  05/13/13  Yes Gregor Hams, MD  predniSONE (DELTASONE) 50 MG tablet Take 1  tablet (50 mg total) by mouth daily. 04/24/15   Alfonzo Beers, MD   BP 110/60 mmHg  Pulse 91  Temp(Src) 98.4 F (36.9 C) (Oral)  Resp 16  Ht 5\' 11"  (1.803 m)  Wt 317 lb 11.2 oz (144.108 kg)  BMI 44.33 kg/m2  SpO2 98%  Vitals reviewed Physical Exam  Physical Examination: General appearance - alert, well appearing, and in no distress Mental status - alert, oriented to person, place, and time Eyes - no conjunctival injection, no scleral icterus Mouth - mucous membranes moist, pharynx normal without lesions Chest - clear to auscultation, no wheezes, rales or rhonchi, symmetric air entry, normal respiratory effort Heart - normal rate, regular rhythm, normal S1, S2, no murmurs, rubs, clicks or gallops Abdomen - soft, nontender, nondistended, no masses or organomegaly Neurological - alert, oriented, normal speech Extremities - peripheral pulses normal, no pedal edema, no clubbing or cyanosis Skin - normal coloration and turgor, no rashes  ED Course  Procedures (including critical care time)  4:30 AM went to see patient, he was being taken to xray Labs Review Labs Reviewed  BASIC METABOLIC PANEL - Abnormal; Notable for the following:    Glucose, Bld 150 (*)    Creatinine, Ser 1.34 (*)    GFR calc non Af Amer 59 (*)    All other components within normal limits  CBC - Abnormal; Notable for the  following:    RBC 4.11 (*)    Hemoglobin 12.8 (*)    All other components within normal limits  Randolm Idol, ED    Imaging Review Dg Chest 2 View  04/24/2015  CLINICAL DATA:  Dyspnea, onset tonight. Improved with breathing treatment. EXAM: CHEST  2 VIEW COMPARISON:  04/14/2010 FINDINGS: The heart size and mediastinal contours are within normal limits. Both lungs are clear. The visualized skeletal structures are unremarkable. IMPRESSION: No active cardiopulmonary disease. Electronically Signed   By: Andreas Newport M.D.   On: 04/24/2015 04:56   I have personally reviewed and evaluated  these images and lab results as part of my medical decision-making.   EKG Interpretation None      MDM   Final diagnoses:  Asthma exacerbation    Pt presentign with wheezing and cough.  Symptoms are c/w prior asthma exacerbations.  He has been using albuterol frequently during the day today.  Pt feels much improved after albuterol neb in the ED.  CXR is reassuring.  His O2 sats are improved from time of arrival.  Pt given abluterol MDI as he has run out, also course of prednisone.  Discharged with strict return precautions.  Pt agreeable with plan.    Alfonzo Beers, MD 04/24/15 [phone]

## 2015-05-30 ENCOUNTER — Encounter (HOSPITAL_COMMUNITY): Payer: Self-pay | Admitting: Emergency Medicine

## 2015-05-30 ENCOUNTER — Emergency Department (INDEPENDENT_AMBULATORY_CARE_PROVIDER_SITE_OTHER)
Admission: EM | Admit: 2015-05-30 | Discharge: 2015-05-30 | Disposition: A | Discharge status: Home / Self Care | Payer: BLUE CROSS/BLUE SHIELD | Source: Home / Self Care | Attending: Family Medicine | Admitting: Family Medicine

## 2015-05-30 DIAGNOSIS — J3489 Other specified disorders of nose and nasal sinuses: Secondary | ICD-10-CM

## 2015-05-30 DIAGNOSIS — J069 Acute upper respiratory infection, unspecified: Secondary | ICD-10-CM | POA: Diagnosis not present

## 2015-05-30 DIAGNOSIS — J4541 Moderate persistent asthma with (acute) exacerbation: Secondary | ICD-10-CM

## 2015-05-30 MED ORDER — PREDNISONE 20 MG PO TABS: ORAL_TABLET | ORAL | Status: DC

## 2015-05-30 MED ORDER — ALBUTEROL SULFATE HFA 108 (90 BASE) MCG/ACT IN AERS: 2.0000 | INHALATION_SPRAY | RESPIRATORY_TRACT | Status: DC | PRN

## 2015-05-30 MED ORDER — IPRATROPIUM-ALBUTEROL 0.5-2.5 (3) MG/3ML IN SOLN
RESPIRATORY_TRACT | Status: AC
  Filled 2015-05-30: qty 3

## 2015-05-30 MED ORDER — ALBUTEROL SULFATE (2.5 MG/3ML) 0.083% IN NEBU
INHALATION_SOLUTION | RESPIRATORY_TRACT | Status: AC
  Filled 2015-05-30: qty 3

## 2015-05-30 MED ORDER — IPRATROPIUM-ALBUTEROL 0.5-2.5 (3) MG/3ML IN SOLN: 3.0000 mL | Freq: Once | RESPIRATORY_TRACT | Status: DC

## 2015-05-30 MED ORDER — ALBUTEROL SULFATE (2.5 MG/3ML) 0.083% IN NEBU: 2.5000 mg | INHALATION_SOLUTION | Freq: Once | RESPIRATORY_TRACT | Status: DC

## 2015-05-30 NOTE — Discharge Instructions (Signed)
Asthma, Acute Bronchospasm Use your albuterol nebulizer machine every 4-6 hours as needed for cough and wheezing. You have also been prescribed albuterol HFA inhaler to be used on the same schedule. Prednisone taper dose as directed. An histamines such as Claritin, Allegra or Zyrtec for drainage. Recommend seeing your PCP or pulmonologist, Dr. Melvyn Novas as soon as possible. It was strongly recommended that she receive a DuoNeb here in the office to help you breathe, limits her cough and opening her airways. By refusing this treatment it may take you longer to get better and he may get worse sooner. Acute bronchospasm caused by asthma is also referred to as an asthma attack. Bronchospasm means your air passages become narrowed. The narrowing is caused by inflammation and tightening of the muscles in the air tubes (bronchi) in your lungs. This can make it hard to breathe or cause you to wheeze and cough. CAUSES Possible triggers are:  Animal dander from the skin, hair, or feathers of animals.  Dust mites contained in house dust.  Cockroaches.  Pollen from trees or grass.  Mold.  Cigarette or tobacco smoke.  Air pollutants such as dust, household cleaners, hair sprays, aerosol sprays, paint fumes, strong chemicals, or strong odors.  Cold air or weather changes. Cold air may trigger inflammation. Winds increase molds and pollens in the air.  Strong emotions such as crying or laughing hard.  Stress.  Certain medicines such as aspirin or beta-blockers.  Sulfites in foods and drinks, such as dried fruits and wine.  Infections or inflammatory conditions, such as a flu, cold, or inflammation of the nasal membranes (rhinitis).  Gastroesophageal reflux disease (GERD). GERD is a condition where stomach acid backs up into your esophagus.  Exercise or strenuous activity. SIGNS AND SYMPTOMS   Wheezing.  Excessive coughing, particularly at night.  Chest tightness.  Shortness of  breath. DIAGNOSIS  Your health care provider will ask you about your medical history and perform a physical exam. A chest X-ray or blood testing may be performed to look for other causes of your symptoms or other conditions that may have triggered your asthma attack. TREATMENT  Treatment is aimed at reducing inflammation and opening up the airways in your lungs. Most asthma attacks are treated with inhaled medicines. These include quick relief or rescue medicines (such as bronchodilators) and controller medicines (such as inhaled corticosteroids). These medicines are sometimes given through an inhaler or a nebulizer. Systemic steroid medicine taken by mouth or given through an IV tube also can be used to reduce the inflammation when an attack is moderate or severe. Antibiotic medicines are only used if a bacterial infection is present.  HOME CARE INSTRUCTIONS   Rest.  Drink plenty of liquids. This helps the mucus to remain thin and be easily coughed up. Only use caffeine in moderation and do not use alcohol until you have recovered from your illness.  Do not smoke. Avoid being exposed to secondhand smoke.  You play a critical role in keeping yourself in good health. Avoid exposure to things that cause you to wheeze or to have breathing problems.  Keep your medicines up-to-date and available. Carefully follow your health care provider's treatment plan.  Take your medicine exactly as prescribed.  When pollen or pollution is bad, keep windows closed and use an air conditioner or go to places with air conditioning.  Asthma requires careful medical care. See your health care provider for a follow-up as advised. If you are more than [redacted] weeks pregnant and  you were prescribed any new medicines, let your obstetrician know about the visit and how you are doing. Follow up with your health care provider as directed.  After you have recovered from your asthma attack, make an appointment with your  outpatient doctor to talk about ways to reduce the likelihood of future attacks. If you do not have a doctor who manages your asthma, make an appointment with a primary care doctor to discuss your asthma. SEEK IMMEDIATE MEDICAL CARE IF:   You are getting worse.  You have trouble breathing. If severe, call your local emergency services (911 in the [place]  You develop chest pain or discomfort.  You are vomiting.  You are not able to keep fluids down.  You are coughing up yellow, green, brown, or bloody sputum.  You have a fever and your symptoms suddenly get worse.  You have trouble swallowing. MAKE SURE YOU:   Understand these instructions.  Will watch your condition.  Will get help right away if you are not doing well or get worse.   This information is not intended to replace advice given to you by your health care provider. Make sure you discuss any questions you have with your health care provider.   Document Released: 09/23/2006 Document Revised: 06/13/2013 Document Reviewed: 12/14/2012 Elsevier Interactive Patient Education 2016 [employer]  Asthma, Adult Asthma is a recurring condition in which the airways tighten and narrow. Asthma can make it difficult to breathe. It can cause coughing, wheezing, and shortness of breath. Asthma episodes, also called asthma attacks, range from minor to life-threatening. Asthma cannot be cured, but medicines and lifestyle changes can help control it. CAUSES Asthma is believed to be caused by inherited (genetic) and environmental factors, but its exact cause is unknown. Asthma may be triggered by allergens, lung infections, or irritants in the air. Asthma triggers are different for each person. Common triggers include:   Animal dander.  Dust mites.  Cockroaches.  Pollen from trees or grass.  Mold.  Smoke.  Air pollutants such as dust, household cleaners, hair sprays, aerosol sprays, paint fumes, strong chemicals, or strong  odors.  Cold air, weather changes, and winds (which increase molds and pollens in the air).  Strong emotional expressions such as crying or laughing hard.  Stress.  Certain medicines (such as aspirin) or types of drugs (such as beta-blockers).  Sulfites in foods and drinks. Foods and drinks that may contain sulfites include dried fruit, potato chips, and sparkling grape juice.  Infections or inflammatory conditions such as the flu, a cold, or an inflammation of the nasal membranes (rhinitis).  Gastroesophageal reflux disease (GERD).  Exercise or strenuous activity. SYMPTOMS Symptoms may occur immediately after asthma is triggered or many hours later. Symptoms include:  Wheezing.  Excessive nighttime or early morning coughing.  Frequent or severe coughing with a common cold.  Chest tightness.  Shortness of breath. DIAGNOSIS  The diagnosis of asthma is made by a review of your medical history and a physical exam. Tests may also be performed. These may include:  Lung function studies. These tests show how much air you breathe in and out.  Allergy tests.  Imaging tests such as X-rays. TREATMENT  Asthma cannot be cured, but it can usually be controlled. Treatment involves identifying and avoiding your asthma triggers. It also involves medicines. There are 2 classes of medicine used for asthma treatment:   Controller medicines. These prevent asthma symptoms from occurring. They are usually taken every day.  Reliever or  rescue medicines. These quickly relieve asthma symptoms. They are used as needed and provide short-term relief. Your health care provider will help you create an asthma action plan. An asthma action plan is a written plan for managing and treating your asthma attacks. It includes a list of your asthma triggers and how they may be avoided. It also includes information on when medicines should be taken and when their dosage should be changed. An action plan may also  involve the use of a device called a peak flow meter. A peak flow meter measures how well the lungs are working. It helps you monitor your condition. HOME CARE INSTRUCTIONS   Take medicines only as directed by your health care provider. Speak with your health care provider if you have questions about how or when to take the medicines.  Use a peak flow meter as directed by your health care provider. Record and keep track of readings.  Understand and use the action plan to help minimize or stop an asthma attack without needing to seek medical care.  Control your home environment in the following ways to help prevent asthma attacks:  Do not smoke. Avoid being exposed to secondhand smoke.  Change your heating and air conditioning filter regularly.  Limit your use of fireplaces and wood stoves.  Get rid of pests (such as roaches and mice) and their droppings.  Throw away plants if you see mold on them.  Clean your floors and dust regularly. Use unscented cleaning products.  Try to have someone else vacuum for you regularly. Stay out of rooms while they are being vacuumed and for a short while afterward. If you vacuum, use a dust mask from a hardware store, a double-layered or microfilter vacuum cleaner bag, or a vacuum cleaner with a HEPA filter.  Replace carpet with wood, tile, or vinyl flooring. Carpet can trap dander and dust.  Use allergy-proof pillows, mattress covers, and box spring covers.  Wash bed sheets and blankets every week in hot water and dry them in a dryer.  Use blankets that are made of polyester or cotton.  Clean bathrooms and kitchens with bleach. If possible, have someone repaint the walls in these rooms with mold-resistant paint. Keep out of the rooms that are being cleaned and painted.  Wash hands frequently. SEEK MEDICAL CARE IF:   You have wheezing, shortness of breath, or a cough even if taking medicine to prevent attacks.  The colored mucus you cough up  (sputum) is thicker than usual.  Your sputum changes from clear or white to yellow, green, gray, or bloody.  You have any problems that may be related to the medicines you are taking (such as a rash, itching, swelling, or trouble breathing).  You are using a reliever medicine more than 2-3 times per week.  Your peak flow is still at 50-79% of your personal best after following your action plan for 1 hour.  You have a fever. SEEK IMMEDIATE MEDICAL CARE IF:   You seem to be getting worse and are unresponsive to treatment during an asthma attack.  You are short of breath even at rest.  You get short of breath when doing very little physical activity.  You have difficulty eating, drinking, or talking due to asthma symptoms.  You develop chest pain.  You develop a fast heartbeat.  You have a bluish color to your lips or fingernails.  You are light-headed, dizzy, or faint.  Your peak flow is less than 50% of your  personal best.   This information is not intended to replace advice given to you by your health care provider. Make sure you discuss any questions you have with your health care provider.   Document Released: 06/08/2005 Document Revised: 02/27/2015 Document Reviewed: 01/05/2013 Elsevier Interactive Patient Education 2016 [employer].  How to Use an Inhaler Using your inhaler correctly is very important. Good technique will make sure that the medicine reaches your lungs.  HOW TO USE AN INHALER:  Take the cap off the inhaler.  If this is the first time using your inhaler, you need to prime it. Shake the inhaler for 5 seconds. Release four puffs into the air, away from your face. Ask your doctor for help if you have questions.  Shake the inhaler for 5 seconds.  Turn the inhaler so the bottle is above the mouthpiece.  Put your pointer finger on top of the bottle. Your thumb holds the bottom of the inhaler.  Open your mouth.  Either hold the inhaler away from your  mouth (the width of 2 fingers) or place your lips tightly around the mouthpiece. Ask your doctor which way to use your inhaler.  Breathe out as much air as possible.  Breathe in and push down on the bottle 1 time to release the medicine. You will feel the medicine go in your mouth and throat.  Continue to take a deep breath in very slowly. Try to fill your lungs.  After you have breathed in completely, hold your breath for 10 seconds. This will help the medicine to settle in your lungs. If you cannot hold your breath for 10 seconds, hold it for as long as you can before you breathe out.  Breathe out slowly, through pursed lips. Whistling is an example of pursed lips.  If your doctor has told you to take more than 1 puff, wait at least 15-30 seconds between puffs. This will help you get the best results from your medicine. Do not use the inhaler more than your doctor tells you to.  Put the cap back on the inhaler.  Follow the directions from your doctor or from the inhaler package about cleaning the inhaler. If you use more than one inhaler, ask your doctor which inhalers to use and what order to use them in. Ask your doctor to help you figure out when you will need to refill your inhaler.  If you use a steroid inhaler, always rinse your mouth with water after your last puff, gargle and spit out the water. Do not swallow the water. GET HELP IF:  The inhaler medicine only partially helps to stop wheezing or shortness of breath.  You are having trouble using your inhaler.  You have some increase in thick spit (phlegm). GET HELP RIGHT AWAY IF:  The inhaler medicine does not help your wheezing or shortness of breath or you have tightness in your chest.  You have dizziness, headaches, or fast heart rate.  You have chills, fever, or night sweats.  You have a large increase of thick spit, or your thick spit is bloody. MAKE SURE YOU:   Understand these instructions.  Will watch your  condition.  Will get help right away if you are not doing well or get worse.   This information is not intended to replace advice given to you by your health care provider. Make sure you discuss any questions you have with your health care provider.   Document Released: 03/17/2008 Document Revised: 03/29/2013 Document Reviewed:  01/05/2013 Elsevier Interactive Patient Education 2016 [employer].  Upper Respiratory Infection, Adult Most upper respiratory infections (URIs) are a viral infection of the air passages leading to the lungs. A URI affects the nose, throat, and upper air passages. The most common type of URI is nasopharyngitis and is typically referred to as "the common cold." URIs run their course and usually go away on their own. Most of the time, a URI does not require medical attention, but sometimes a bacterial infection in the upper airways can follow a viral infection. This is called a secondary infection. Sinus and middle ear infections are common types of secondary upper respiratory infections. Bacterial pneumonia can also complicate a URI. A URI can worsen asthma and chronic obstructive pulmonary disease (COPD). Sometimes, these complications can require emergency medical care and may be life threatening.  CAUSES Almost all URIs are caused by viruses. A virus is a type of germ and can spread from one person to another.  RISKS FACTORS You may be at risk for a URI if:   You smoke.   You have chronic heart or lung disease.  You have a weakened defense (immune) system.   You are very young or very old.   You have nasal allergies or asthma.  You work in crowded or poorly ventilated areas.  You work in health care facilities or schools. SIGNS AND SYMPTOMS  Symptoms typically develop 2-3 days after you come in contact with a cold virus. Most viral URIs last 7-10 days. However, viral URIs from the influenza virus (flu virus) can last 14-18 days and are typically more  severe. Symptoms may include:   Runny or stuffy (congested) nose.   Sneezing.   Cough.   Sore throat.   Headache.   Fatigue.   Fever.   Loss of appetite.   Pain in your forehead, behind your eyes, and over your cheekbones (sinus pain).  Muscle aches.  DIAGNOSIS  Your health care provider may diagnose a URI by:  Physical exam.  Tests to check that your symptoms are not due to another condition such as:  Strep throat.  Sinusitis.  Pneumonia.  Asthma. TREATMENT  A URI goes away on its own with time. It cannot be cured with medicines, but medicines may be prescribed or recommended to relieve symptoms. Medicines may help:  Reduce your fever.  Reduce your cough.  Relieve nasal congestion. HOME CARE INSTRUCTIONS   Take medicines only as directed by your health care provider.   Gargle warm saltwater or take cough drops to comfort your throat as directed by your health care provider.  Use a warm mist humidifier or inhale steam from a shower to increase air moisture. This may make it easier to breathe.  Drink enough fluid to keep your urine clear or pale yellow.   Eat soups and other clear broths and maintain good nutrition.   Rest as needed.   Return to work when your temperature has returned to normal or as your health care provider advises. You may need to stay home longer to avoid infecting others. You can also use a face mask and careful hand washing to prevent spread of the virus.  Increase the usage of your inhaler if you have asthma.   Do not use any tobacco products, including cigarettes, chewing tobacco, or electronic cigarettes. If you need help quitting, ask your health care provider. PREVENTION  The best way to protect yourself from getting a cold is to practice good hygiene.  Avoid oral or hand contact with people with cold symptoms.   Wash your hands often if contact occurs.  There is no clear evidence that vitamin C, vitamin  E, echinacea, or exercise reduces the chance of developing a cold. However, it is always recommended to get plenty of rest, exercise, and practice good nutrition.  SEEK MEDICAL CARE IF:   You are getting worse rather than better.   Your symptoms are not controlled by medicine.   You have chills.  You have worsening shortness of breath.  You have brown or red mucus.  You have yellow or brown nasal discharge.  You have pain in your face, especially when you bend forward.  You have a fever.  You have swollen neck glands.  You have pain while swallowing.  You have white areas in the back of your throat. SEEK IMMEDIATE MEDICAL CARE IF:   You have severe or persistent:  Headache.  Ear pain.  Sinus pain.  Chest pain.  You have chronic lung disease and any of the following:  Wheezing.  Prolonged cough.  Coughing up blood.  A change in your usual mucus.  You have a stiff neck.  You have changes in your:  Vision.  Hearing.  Thinking.  Mood. MAKE SURE YOU:   Understand these instructions.  Will watch your condition.  Will get help right away if you are not doing well or get worse.   This information is not intended to replace advice given to you by your health care provider. Make sure you discuss any questions you have with your health care provider.   Document Released: 12/02/2000 Document Revised: 10/23/2014 Document Reviewed: 09/13/2013 Elsevier Interactive Patient Education [employer].

## 2015-05-30 NOTE — ED Notes (Signed)
Patient refused medication, notified david mabe, np

## 2015-05-30 NOTE — ED Notes (Signed)
Reports symptoms for a week: runny nose, sinus drainage, cough, coughing up green/white phlegm, sore throat, and sob with coughing episodes.  Patient unsure about fever.

## 2015-05-30 NOTE — ED Provider Notes (Signed)
CSN: [id number]     Arrival date & time 05/30/15  1300 History   First MD Initiated Contact with Patient 05/30/15 1328     Chief Complaint  Patient presents with  . URI   (Consider location/radiation/quality/duration/timing/severity/associated sxs/prior Treatment) HPI Comments: 54 year old severely obese male complaining of shortness of breath and a cough for one week. Is also complaining of runny nose and sore throat. He denies PND or fever. Pt uncertain as to his past respiratory hx and diagnosis. He has a history of either reactive airway disease versus asthma. He was given a nebulizer treatment with albuterol at one point, approximately one year ago. He did not recognize that his symptoms may be due to wheezing.  Patient is a 54 y.o. male presenting with URI.  URI Presenting symptoms: congestion, cough, fever, rhinorrhea and sore throat   Presenting symptoms: no ear pain and no fatigue   Associated symptoms: no neck pain     Past Medical History  Diagnosis Date  . Asthma     hfa 75% May 21, 2010 > 90% 07/03/10; PFT's FET1 1.86 (50%) ratop 57 > nl p B2 ( 68% better) wotj DLCO nl  . Reflux    Past Surgical History  Procedure Laterality Date  . Polyp on vocal cord removed  2005  . Throat surgery      polyp   Family History  Problem Relation Age of Onset  . Asthma Mother   . Heart disease Father    Social History  Substance Use Topics  . Smoking status: Never Smoker   . Smokeless tobacco: None  . Alcohol Use: Yes    Review of Systems  Constitutional: Positive for fever and activity change. Negative for diaphoresis and fatigue.  HENT: Positive for congestion, rhinorrhea and sore throat. Negative for ear pain, facial swelling, postnasal drip and trouble swallowing.   Eyes: Negative for pain, discharge and redness.  Respiratory: Positive for cough and shortness of breath. Negative for chest tightness.   Cardiovascular: Negative.  Negative for chest pain.   Gastrointestinal: Negative.   Genitourinary: Negative.   Musculoskeletal: Negative.  Negative for neck pain and neck stiffness.  Neurological: Negative.   All other systems reviewed and are negative.   Allergies  Review of patient's allergies indicates no known allergies.  Home Medications   Prior to Admission medications   Medication Sig Start Date End Date Taking? Authorizing Provider  Dextromethorphan Polistirex (DELSYM PO) Take by mouth.   Yes Historical Provider, MD  dextromethorphan-guaiFENesin (MUCINEX DM) 30-600 MG 12hr tablet Take 1 tablet by mouth 2 (two) times daily.   Yes Historical Provider, MD  Esomeprazole Magnesium (NEXIUM PO) Take by mouth.   Yes Historical Provider, MD  albuterol (PROVENTIL HFA;VENTOLIN HFA) 108 (90 BASE) MCG/ACT inhaler Inhale 2 puffs into the lungs every 4 (four) hours as needed for wheezing or shortness of breath. 05/30/15   Janne Napoleon, NP  predniSONE (DELTASONE) 20 MG tablet 3 Tabs PO Days 1-3, then 2 tabs PO Days 4-6, then 1 tab PO Day 7-9, then Half Tab PO Day 10-12 05/30/15   Janne Napoleon, NP   Meds Ordered and Administered this Visit   Medications  ipratropium-albuterol (DUONEB) 0.5-2.5 (3) MG/3ML nebulizer solution 3 mL (not administered)  albuterol (PROVENTIL) (2.5 MG/3ML) 0.083% nebulizer solution 2.5 mg (not administered)    BP 139/87 mmHg  Pulse 80  Temp(Src) 98.4 F (36.9 C) (Oral)  Resp 22  SpO2 96% No data found.   Physical Exam  Constitutional: He  is oriented to person, place, and time. He appears well-developed and well-nourished. No distress.  HENT:  Mouth/Throat: No oropharyngeal exudate.  Bilateral TMs are normal Oropharynx with minor erythema, cobblestoning and clear PND  Eyes: Conjunctivae and EOM are normal.  Neck: Normal range of motion. Neck supple.  Cardiovascular: Normal rate, regular rhythm and normal heart sounds.   Pulmonary/Chest: Effort normal and breath sounds normal. No respiratory distress.  Expiratory  wheezes bilaterally. Prolonged expiratory phase. Fair air movement.  Musculoskeletal: Normal range of motion. He exhibits no edema.  Lymphadenopathy:    He has no cervical adenopathy.  Neurological: He is alert and oriented to person, place, and time.  Skin: Skin is warm and dry. No rash noted.  Psychiatric: He has a normal mood and affect.  Nursing note and vitals reviewed.   ED Course  Procedures (including critical care time)  Labs Review Labs Reviewed - No data to display  Imaging Review No results found.   Visual Acuity Review  Right Eye Distance:   Left Eye Distance:   Bilateral Distance:    Right Eye Near:   Left Eye Near:    Bilateral Near:         MDM   1. URI (upper respiratory infection)   2. Sinus drainage   3. Asthma exacerbation attacks, moderate persistent    For unknown reasons the patient refused to have his DuoNeb. He said something about taking too much time to do all of that stuff. The purpose of the DuoNeb was explained in detail. It was explained that his cough is primarily due to his severe bronchospasm as well as draining in the back of his throat that he does not perceive. He insists that he can do the same thing with his nebulizer at home and request another albuterol HFA and prednisone. He was also advised that he may not get better as quickly or as well as he would like by refusing this particular DuoNeb. Asthma, Acute Bronchospasm Use your albuterol nebulizer machine every 4-6 hours as needed for cough and wheezing. You have also been prescribed albuterol HFA inhaler to be used on the same schedule. Prednisone taper dose as directed. An histamines such as Claritin, Allegra or Zyrtec for drainage. Recommend seeing your PCP or pulmonologist, Dr. Melvyn Novas as soon as possible. It was strongly recommended that she receive a DuoNeb here in the office to help you breathe, limits her cough and opening her airways. By refusing this treatment it may take  you longer to get better and he may get worse sooner.    Janne Napoleon, NP 05/30/15 1409

## 2016-08-17 ENCOUNTER — Ambulatory Visit: Payer: BLUE CROSS/BLUE SHIELD | Admitting: Family Medicine

## 2016-08-18 ENCOUNTER — Ambulatory Visit (INDEPENDENT_AMBULATORY_CARE_PROVIDER_SITE_OTHER): Payer: BLUE CROSS/BLUE SHIELD | Admitting: Physician Assistant

## 2016-08-18 ENCOUNTER — Ambulatory Visit (INDEPENDENT_AMBULATORY_CARE_PROVIDER_SITE_OTHER): Payer: BLUE CROSS/BLUE SHIELD

## 2016-08-18 ENCOUNTER — Telehealth: Payer: Self-pay | Admitting: *Deleted

## 2016-08-18 ENCOUNTER — Ambulatory Visit (HOSPITAL_COMMUNITY)
Admission: RE | Admit: 2016-08-18 | Discharge: 2016-08-18 | Disposition: A | Discharge status: Home / Self Care | Payer: BLUE CROSS/BLUE SHIELD | Source: Ambulatory Visit | Attending: Physician Assistant | Admitting: Physician Assistant

## 2016-08-18 ENCOUNTER — Encounter: Payer: Self-pay | Admitting: Physician Assistant

## 2016-08-18 VITALS — BP 146/98 | HR 90 | Temp 98.6°F | Resp 20 | Ht 68.0 in | Wt 297.2 lb

## 2016-08-18 DIAGNOSIS — J4541 Moderate persistent asthma with (acute) exacerbation: Secondary | ICD-10-CM

## 2016-08-18 DIAGNOSIS — R0683 Snoring: Secondary | ICD-10-CM | POA: Diagnosis not present

## 2016-08-18 DIAGNOSIS — J181 Lobar pneumonia, unspecified organism: Secondary | ICD-10-CM | POA: Diagnosis not present

## 2016-08-18 DIAGNOSIS — J189 Pneumonia, unspecified organism: Secondary | ICD-10-CM

## 2016-08-18 LAB — COMPREHENSIVE METABOLIC PANEL
ALT: 14 U/L (ref 0–53)
AST: 13 U/L (ref 0–37)
Albumin: 4 g/dL (ref 3.5–5.2)
Alkaline Phosphatase: 50 U/L (ref 39–117)
BUN: 13 mg/dL (ref 6–23)
CHLORIDE: 103 meq/L (ref 96–112)
CO2: 33 meq/L — AB (ref 19–32)
Calcium: 9.3 mg/dL (ref 8.4–10.5)
Creatinine, Ser: 1.11 mg/dL (ref 0.40–1.50)
GFR: 88.29 mL/min (ref >60.00)
Glucose, Bld: 96 mg/dL (ref 70–99)
POTASSIUM: 4.4 meq/L (ref 3.5–5.1)
SODIUM: 140 meq/L (ref 135–145)
TOTAL PROTEIN: 7.5 g/dL (ref 6.0–8.3)
Total Bilirubin: 0.6 mg/dL (ref 0.2–1.2)

## 2016-08-18 LAB — CBC WITH DIFFERENTIAL/PLATELET
Basophils Absolute: 0.1 10*3/uL (ref 0.0–0.1)
Basophils Relative: 1.3 % (ref 0.0–3.0)
EOS PCT: 7 % — AB (ref 0.0–5.0)
Eosinophils Absolute: 0.4 10*3/uL (ref 0.0–0.7)
HCT: 35.8 % — ABNORMAL LOW (ref 39.0–52.0)
Hemoglobin: 11.8 g/dL — ABNORMAL LOW (ref 13.0–17.0)
Lymphocytes Relative: 26.5 % (ref 12.0–46.0)
Lymphs Abs: 1.4 10*3/uL (ref 0.7–4.0)
MCHC: 33 g/dL (ref 30.0–36.0)
MCV: 94.6 fl (ref 78.0–100.0)
Monocytes Absolute: 0.6 10*3/uL (ref 0.1–1.0)
Monocytes Relative: 10.4 % (ref 3.0–12.0)
Neutro Abs: 3 10*3/uL (ref 1.4–7.7)
Neutrophils Relative %: 54.8 % (ref 43.0–77.0)
Platelets: 317 10*3/uL (ref 150.0–400.0)
RBC: 3.79 Mil/uL — AB (ref 4.22–5.81)
RDW: 12.9 % (ref 11.5–15.5)
WBC: 5.4 10*3/uL (ref 4.0–10.5)

## 2016-08-18 MED ORDER — ESOMEPRAZOLE MAGNESIUM 40 MG PO CPDR: 40.0000 mg | DELAYED_RELEASE_CAPSULE | Freq: Every day | ORAL | 2 refills | Status: DC

## 2016-08-18 MED ORDER — LEVALBUTEROL HCL 1.25 MG/0.5ML IN NEBU
1.2500 mg | INHALATION_SOLUTION | Freq: Once | RESPIRATORY_TRACT | Status: AC
  Administered 2016-08-18: 1.25 mg via RESPIRATORY_TRACT

## 2016-08-18 MED ORDER — ALBUTEROL SULFATE (2.5 MG/3ML) 0.083% IN NEBU: 2.5000 mg | INHALATION_SOLUTION | Freq: Four times a day (QID) | RESPIRATORY_TRACT | 2 refills | Status: DC | PRN

## 2016-08-18 MED ORDER — ALBUTEROL SULFATE HFA 108 (90 BASE) MCG/ACT IN AERS: 1.0000 | INHALATION_SPRAY | Freq: Four times a day (QID) | RESPIRATORY_TRACT | 2 refills | Status: DC | PRN

## 2016-08-18 MED ORDER — MOMETASONE FURO-FORMOTEROL FUM 100-5 MCG/ACT IN AERO: 2.0000 | INHALATION_SPRAY | Freq: Two times a day (BID) | RESPIRATORY_TRACT | 2 refills | Status: DC | PRN

## 2016-08-18 MED ORDER — DOXYCYCLINE HYCLATE 100 MG PO TABS: 100.0000 mg | ORAL_TABLET | Freq: Two times a day (BID) | ORAL | 0 refills | Status: DC

## 2016-08-18 MED ORDER — BENZONATATE 200 MG PO CAPS: 200.0000 mg | ORAL_CAPSULE | Freq: Two times a day (BID) | ORAL | 0 refills | Status: DC | PRN

## 2016-08-18 NOTE — Progress Notes (Signed)
Subjective:    Patient ID: Christopher Steele, male    DOB: May 26, 1961, 56 y.o.   MRN: [id number]  HPI  Christopher Steele is a 56 y/o male who presents to clinic today to establish care.  Chronic SOB/cough: patient has been seen by Dr. Melvyn Novas, pulmonologist, in the past, last seen in 2013. He was diagnosed with asthma vs "classic upper airway cough syndrome" and was treated with Dulera, Albuterol, and Nexium and recommended a GERD diet. Per chart review, patient has been non-compliant with his care with the pulmonologist. He presents today with a 2-3 month history of cough. He has been seen in the urgent care at least 2-3 times since December. He has been on 3 rounds of antibiotics and 2 rounds of oral steroids. He has not had a chest xray. He has been using Proair inhaler and has also been using a nebulizer prn. He has not had any fevers x 1 month. He has had decreased appetite since his cough and has lost about 15 lb. He denies chest pain, lower leg swelling, headaches, vision changes. He does state that this wife has noticed that he has been snoring a lot and that he stops breathing when he is snoring, he states that he has never had a sleep study. He does not smoke.  Review of Systems  See HPI  Past Medical History:  Diagnosis Date  . Asthma    hfa 75% May 21, 2010 > 90% 07/03/10; PFT's FET1 1.86 (50%) ratop 57 > nl p B2 ( 68% better) wotj DLCO nl  . Reflux      Social History   Social History  . Marital status: Married    Spouse name: N/A  . Number of children: N/A  . Years of education: N/A   Occupational History  . Not on file.   Social History Main Topics  . Smoking status: Never Smoker  . Smokeless tobacco: Never Used  . Alcohol use Yes     Comment: rarely  . Drug use: No  . Sexual activity: No   Other Topics Concern  . Not on file   Social History Narrative  . No narrative on file    Past Surgical History:  Procedure Laterality Date  . polyp on vocal cord removed   2005  . THROAT SURGERY     polyp    Family History  Problem Relation Age of Onset  . Asthma Mother   . Heart disease Father     No Known Allergies  No current outpatient prescriptions on file prior to visit.   No current facility-administered medications on file prior to visit.     BP (!) 146/98 (BP Location: Left Arm, Patient Position: Sitting, Cuff Size: Large)   Pulse 90   Temp 98.6 F (37 C) (Oral)   Resp 20   Ht 5\' 8"  (1.727 m)   Wt 297 lb 4 oz (134.8 kg)   SpO2 94%   BMI 45.20 kg/m      Objective:   Physical Exam  Constitutional: He appears well-developed and well-nourished. He is cooperative.  Non-toxic appearance. He does not have a sickly appearance. He does not appear ill. No distress.  HENT:  Head: Normocephalic and atraumatic.  Right Ear: Tympanic membrane, external ear and ear canal normal. Tympanic membrane is not erythematous, not retracted and not bulging.  Left Ear: Tympanic membrane, external ear and ear canal normal. Tympanic membrane is not erythematous, not retracted and not bulging.  Nose: Nose normal.  Mouth/Throat: Uvula is midline and mucous membranes are normal. No posterior oropharyngeal edema or posterior oropharyngeal erythema.  Cardiovascular: Normal rate, regular rhythm, normal heart sounds and intact distal pulses.   No LE swelling  Pulmonary/Chest: Effort normal. No accessory muscle usage. No respiratory distress. He has no decreased breath sounds. He has wheezes (bilateral mild expiratory wheezes --> resolved completely after nebulizer treatment). He has no rhonchi. He has no rales.  Lymphadenopathy:    He has no cervical adenopathy.  Neurological: He is alert.  Skin: Skin is warm, dry and intact.  Nursing note and vitals reviewed.  CXR: possible early infiltrate in RLL vs atelectasis     Assessment & Plan:  1. Moderate persistent asthma with exacerbation Pulmonary appointment to re-establish care with Dr. Melvyn Novas is for March 20.  Labs pending. Re-start Dulera and Nexium. Discontinue Zantac as he is not finding this effective. Refilled albuterol inhaler and nebulizer solution.  - CBC with Differential/Platelet - Comprehensive metabolic panel - DG Chest 2 View; Future - levalbuterol (XOPENEX) nebulizer solution 1.25 mg; Take 1.25 mg by nebulization once.  2. Snoring Pulmonary appointment to re-establish care with Dr. Melvyn Novas is for March 20, discussed with patient that he should discuss being evaluated for sleep apnea.  3. Community acquired pneumonia of right lower lobe of lung ([place]) CXR shows possible infiltrate in RLL, discussed results with patient over the phone. Will start doxycycline. Tessalon perles for cough. Advised patient that if he develops worsening fevers or SOB, he needs to be re-evaluated. Patient verbalized understanding.  Additionally, patient was interested in meeting with Lucina Mellow, clinic RN manager to discuss Advanced Directives during today's visit.  [name] PA-C 08/18/16

## 2016-08-18 NOTE — Patient Instructions (Addendum)
It was great meeting you today! We will call you with your lab and chest xray results.  Thank your for scheduling an appointment with Dr. Melvyn Novas.  I have sent them a note that you also need to have a sleep study to evaluate whether or not you have sleep apnea.  Use the [facility] every 12 hours as needed. Discontinue the Zantac. Start the 40 mg Nexium, take one 30-60 minutes before your first meal.  I would like for you to follow-up with [place] in 2 weeks so we can re-check your blood pressure and perform a physical exam.     Cough, Adult Coughing is a reflex that clears your throat and your airways. Coughing helps to heal and protect your lungs. It is normal to cough occasionally, but a cough that happens with other symptoms or lasts a long time may be a sign of a condition that needs treatment. A cough may last only 2-3 weeks (acute), or it may last longer than 8 weeks (chronic). What are the causes? Coughing is commonly caused by:  Breathing in substances that irritate your lungs.  A viral or bacterial respiratory infection.  Allergies.  Asthma.  Postnasal drip.  Smoking.  Acid backing up from the stomach into the esophagus (gastroesophageal reflux).  Certain medicines.  Chronic lung problems, including COPD (or rarely, lung cancer).  Other medical conditions such as heart failure. Follow these instructions at home: Pay attention to any changes in your symptoms. Take these actions to help with your discomfort:  Take medicines only as told by your health care provider.  If you were prescribed an antibiotic medicine, take it as told by your health care provider. Do not stop taking the antibiotic even if you start to feel better.  Talk with your health care provider before you take a cough suppressant medicine.  Drink enough fluid to keep your urine clear or pale yellow.  If the air is dry, use a cold steam vaporizer or humidifier in your bedroom or your home to help loosen  secretions.  Avoid anything that causes you to cough at work or at home.  If your cough is worse at night, try sleeping in a semi-upright position.  Avoid cigarette smoke. If you smoke, quit smoking. If you need help quitting, ask your health care provider.  Avoid caffeine.  Avoid alcohol.  Rest as needed. Contact a health care provider if:  You have new symptoms.  You cough up pus.  Your cough does not get better after 2-3 weeks, or your cough gets worse.  You cannot control your cough with suppressant medicines and you are losing sleep.  You develop pain that is getting worse or pain that is not controlled with pain medicines.  You have a fever.  You have unexplained weight loss.  You have night sweats. Get help right away if:  You cough up blood.  You have difficulty breathing.  Your heartbeat is very fast. This information is not intended to replace advice given to you by your health care provider. Make sure you discuss any questions you have with your health care provider. Document Released: 12/05/2010 Document Revised: 11/14/2015 Document Reviewed: 08/15/2014 Elsevier Interactive Patient Education  2017 [employer].

## 2016-08-18 NOTE — Telephone Encounter (Signed)
Pt calling saying medication [name] ordered was too expensive: Nexium $260.00 and Dulera $80.00, ProAir $75.00. Pt said he will get Nexium OTC which is only $25.00 and he can not afford both Inhalers. Told pt to get [facility] that is the one she really wants him to take. Pt verbalized understanding. Samantha notified.

## 2016-08-18 NOTE — Progress Notes (Signed)
Pre visit review using our clinic review tool, if applicable. No additional management support is needed unless otherwise documented below in the visit note. 

## 2016-08-19 ENCOUNTER — Telehealth: Payer: Self-pay | Admitting: Physician Assistant

## 2016-08-19 ENCOUNTER — Other Ambulatory Visit (INDEPENDENT_AMBULATORY_CARE_PROVIDER_SITE_OTHER): Payer: BLUE CROSS/BLUE SHIELD

## 2016-08-19 ENCOUNTER — Other Ambulatory Visit: Payer: Self-pay

## 2016-08-19 DIAGNOSIS — D649 Anemia, unspecified: Secondary | ICD-10-CM

## 2016-08-19 DIAGNOSIS — J45901 Unspecified asthma with (acute) exacerbation: Principal | ICD-10-CM

## 2016-08-19 DIAGNOSIS — J441 Chronic obstructive pulmonary disease with (acute) exacerbation: Secondary | ICD-10-CM

## 2016-08-19 LAB — FERRITIN: Ferritin: 250.7 ng/mL (ref 22.0–322.0)

## 2016-08-19 LAB — IBC PANEL
IRON: 47 ug/dL (ref 42–165)
Saturation Ratios: 15.4 % — ABNORMAL LOW (ref 20.0–50.0)
Transferrin: 218 mg/dL (ref 212.0–360.0)

## 2016-08-19 LAB — IRON: IRON: 47 ug/dL (ref 42–165)

## 2016-08-19 NOTE — Telephone Encounter (Signed)
Patient does not want to take the esomeprazole (NEXIUM) 40 MG capsule. Patient wants to stay on ranitigine and needs a refill on that rx sent to:  [facility], [place] [phone] (Phone) [phone] (Fax)   Patient also stated that "someone" gave him a call. I looked at the last note and did not see who may have given him that call. Patient stated they did not leave a voicemail.   Please call patient to advise on all of the above

## 2016-08-20 MED ORDER — RANITIDINE HCL 300 MG PO TABS: 300.0000 mg | ORAL_TABLET | Freq: Every day | ORAL | 1 refills | Status: DC

## 2016-08-20 NOTE — Progress Notes (Signed)
Spent 28 minutes discussing the following issues regarding Advance Directives:   -Goals of care  -Code status: difference between a full code, limited code (MOST form), and DNR -Difference between hospice and palliative care -Living Will  -Reason and significance for power of attorney -Reviewed each page in Advance Directive packet and answered any questions by patient  Patient was alert and oriented x4 and appeared to have no cognitive impairments during visit. No paperwork was completed during visit.

## 2016-08-20 NOTE — Addendum Note (Signed)
Addended by: Marian Sorrow on: 08/20/2016 01:03 PM   Modules accepted: Orders

## 2016-08-20 NOTE — Telephone Encounter (Signed)
Discussed with [name], okay for pt to go back on Ranitidine 300 mg daily. Rx sent to pharmacy. See result notes.

## 2016-08-21 ENCOUNTER — Other Ambulatory Visit: Payer: Self-pay | Admitting: Physician Assistant

## 2016-08-21 ENCOUNTER — Encounter: Payer: Self-pay | Admitting: Gastroenterology

## 2016-08-21 DIAGNOSIS — Z1212 Encounter for screening for malignant neoplasm of rectum: Principal | ICD-10-CM

## 2016-08-21 DIAGNOSIS — Z1211 Encounter for screening for malignant neoplasm of colon: Secondary | ICD-10-CM

## 2016-08-21 DIAGNOSIS — D649 Anemia, unspecified: Secondary | ICD-10-CM

## 2016-09-01 ENCOUNTER — Ambulatory Visit: Payer: BLUE CROSS/BLUE SHIELD | Admitting: Physician Assistant

## 2016-09-02 ENCOUNTER — Ambulatory Visit (INDEPENDENT_AMBULATORY_CARE_PROVIDER_SITE_OTHER): Payer: BLUE CROSS/BLUE SHIELD | Admitting: Physician Assistant

## 2016-09-02 ENCOUNTER — Encounter: Payer: Self-pay | Admitting: Physician Assistant

## 2016-09-02 ENCOUNTER — Telehealth: Payer: Self-pay | Admitting: *Deleted

## 2016-09-02 VITALS — BP 162/88 | HR 73 | Temp 98.4°F | Ht 68.0 in | Wt 303.2 lb

## 2016-09-02 DIAGNOSIS — Z23 Encounter for immunization: Secondary | ICD-10-CM

## 2016-09-02 DIAGNOSIS — R053 Chronic cough: Secondary | ICD-10-CM

## 2016-09-02 DIAGNOSIS — K219 Gastro-esophageal reflux disease without esophagitis: Secondary | ICD-10-CM | POA: Diagnosis not present

## 2016-09-02 DIAGNOSIS — J181 Lobar pneumonia, unspecified organism: Secondary | ICD-10-CM

## 2016-09-02 DIAGNOSIS — Z0001 Encounter for general adult medical examination with abnormal findings: Secondary | ICD-10-CM

## 2016-09-02 DIAGNOSIS — Z6841 Body Mass Index (BMI) 40.0 and over, adult: Secondary | ICD-10-CM

## 2016-09-02 DIAGNOSIS — R05 Cough: Secondary | ICD-10-CM

## 2016-09-02 DIAGNOSIS — J189 Pneumonia, unspecified organism: Secondary | ICD-10-CM

## 2016-09-02 DIAGNOSIS — I10 Essential (primary) hypertension: Secondary | ICD-10-CM | POA: Insufficient documentation

## 2016-09-02 DIAGNOSIS — D649 Anemia, unspecified: Secondary | ICD-10-CM

## 2016-09-02 DIAGNOSIS — E538 Deficiency of other specified B group vitamins: Secondary | ICD-10-CM | POA: Insufficient documentation

## 2016-09-02 LAB — FOLATE: Folate: 9.8 ng/mL (ref >5.9)

## 2016-09-02 LAB — LIPID PANEL
CHOLESTEROL: 151 mg/dL (ref 0–200)
HDL: 56.4 mg/dL (ref >39.00)
LDL CALC: 83 mg/dL (ref 0–99)
NonHDL: 94.5
TRIGLYCERIDES: 59 mg/dL (ref 0.0–149.0)
Total CHOL/HDL Ratio: 3
VLDL: 11.8 mg/dL (ref 0.0–40.0)

## 2016-09-02 LAB — CBC WITH DIFFERENTIAL/PLATELET
BASOS PCT: 0.9 % (ref 0.0–3.0)
Basophils Absolute: 0 10*3/uL (ref 0.0–0.1)
EOS ABS: 0.4 10*3/uL (ref 0.0–0.7)
Eosinophils Relative: 9.9 % — ABNORMAL HIGH (ref 0.0–5.0)
HEMATOCRIT: 38.7 % — AB (ref 39.0–52.0)
Hemoglobin: 12.7 g/dL — ABNORMAL LOW (ref 13.0–17.0)
LYMPHS PCT: 31.2 % (ref 12.0–46.0)
Lymphs Abs: 1.2 10*3/uL (ref 0.7–4.0)
MCHC: 32.8 g/dL (ref 30.0–36.0)
MCV: 95.6 fl (ref 78.0–100.0)
Monocytes Absolute: 0.3 10*3/uL (ref 0.1–1.0)
Monocytes Relative: 6.8 % (ref 3.0–12.0)
NEUTROS ABS: 2 10*3/uL (ref 1.4–7.7)
Neutrophils Relative %: 51.2 % (ref 43.0–77.0)
PLATELETS: 270 10*3/uL (ref 150.0–400.0)
RBC: 4.05 Mil/uL — ABNORMAL LOW (ref 4.22–5.81)
RDW: 13.7 % (ref 11.5–15.5)
WBC: 4 10*3/uL (ref 4.0–10.5)

## 2016-09-02 LAB — RETICULOCYTES
ABS RETIC: 89320 {cells}/uL (ref 25000–90000)
RBC.: 4.06 MIL/uL — ABNORMAL LOW (ref 4.20–5.80)
Retic Ct Pct: 2.2 %

## 2016-09-02 LAB — VITAMIN B12: Vitamin B-12: 188 pg/mL — ABNORMAL LOW (ref 211–911)

## 2016-09-02 LAB — PSA: PSA: 1 ng/mL (ref 0.10–4.00)

## 2016-09-02 MED ORDER — AMLODIPINE BESYLATE 5 MG PO TABS: 5.0000 mg | ORAL_TABLET | Freq: Every day | ORAL | 3 refills | Status: DC

## 2016-09-02 NOTE — Assessment & Plan Note (Addendum)
Patient meets criteria for hypertension at today's visit. This is a new diagnosis for patient. Will start 5 mg Norvasc. Patient to follow-up with [place] in 1 month for nurse blood pressure check. I recommended patient buy a blood pressure machine and take blood pressure daily, bring results to [place] at follow-up. I suspect that patient has sleep apnea and this is contributing to this. Heart Healthy diet.

## 2016-09-02 NOTE — Patient Instructions (Signed)
It was great seeing you today!  Please go to the lab to complete your blood work.  Start the blood pressure medication and take daily. Please buy a blood pressure cuff and check your blood pressure for [place] at least daily. We will see you in about 1 month to re-check your Bp.    Hypertension Hypertension, commonly called high blood pressure, is when the force of blood pumping through the arteries is too strong. The arteries are the blood vessels that carry blood from the heart throughout the body. Hypertension forces the heart to work harder to pump blood and may cause arteries to become narrow or stiff. Having untreated or uncontrolled hypertension can cause heart attacks, strokes, kidney disease, and other problems. A blood pressure reading consists of a higher number over a lower number. Ideally, your blood pressure should be below 120/80. The first ("top") number is called the systolic pressure. It is a measure of the pressure in your arteries as your heart beats. The second ("bottom") number is called the diastolic pressure. It is a measure of the pressure in your arteries as the heart relaxes. What are the causes? The cause of this condition is not known. What increases the risk? Some risk factors for high blood pressure are under your control. Others are not. Factors you can change   Smoking.  Having type 2 diabetes mellitus, high cholesterol, or both.  Not getting enough exercise or physical activity.  Being overweight.  Having too much fat, sugar, calories, or salt (sodium) in your diet.  Drinking too much alcohol. Factors that are difficult or impossible to change   Having chronic kidney disease.  Having a family history of high blood pressure.  Age. Risk increases with age.  Race. You may be at higher risk if you are African-American.  Gender. Men are at higher risk than women before age 18. After age 12, women are at higher risk than men.  Having obstructive sleep  apnea.  Stress. What are the signs or symptoms? Extremely high blood pressure (hypertensive crisis) may cause:  Headache.  Anxiety.  Shortness of breath.  Nosebleed.  Nausea and vomiting.  Severe chest pain.  Jerky movements you cannot control (seizures). How is this diagnosed? This condition is diagnosed by measuring your blood pressure while you are seated, with your arm resting on a surface. The cuff of the blood pressure monitor will be placed directly against the skin of your upper arm at the level of your heart. It should be measured at least twice using the same arm. Certain conditions can cause a difference in blood pressure between your right and left arms. Certain factors can cause blood pressure readings to be lower or higher than normal (elevated) for a short period of time:  When your blood pressure is higher when you are in a health care provider's office than when you are at home, this is called white coat hypertension. Most people with this condition do not need medicines.  When your blood pressure is higher at home than when you are in a health care provider's office, this is called masked hypertension. Most people with this condition may need medicines to control blood pressure. If you have a high blood pressure reading during one visit or you have normal blood pressure with other risk factors:  You may be asked to return on a different day to have your blood pressure checked again.  You may be asked to monitor your blood pressure at home for 1  week or longer. If you are diagnosed with hypertension, you may have other blood or imaging tests to help your health care provider understand your overall risk for other conditions. How is this treated? This condition is treated by making healthy lifestyle changes, such as eating healthy foods, exercising more, and reducing your alcohol intake. Your health care provider may prescribe medicine if lifestyle changes are not  enough to get your blood pressure under control, and if:  Your systolic blood pressure is above 130.  Your diastolic blood pressure is above 80. Your personal target blood pressure may vary depending on your medical conditions, your age, and other factors. Follow these instructions at home: Eating and drinking   Eat a diet that is high in fiber and potassium, and low in sodium, added sugar, and fat. An example eating plan is called the DASH (Dietary Approaches to Stop Hypertension) diet. To eat this way:  Eat plenty of fresh fruits and vegetables. Try to fill half of your plate at each meal with fruits and vegetables.  Eat whole grains, such as whole wheat pasta, brown rice, or whole grain bread. Fill about one quarter of your plate with whole grains.  Eat or drink low-fat dairy products, such as skim milk or low-fat yogurt.  Avoid fatty cuts of meat, processed or cured meats, and poultry with skin. Fill about one quarter of your plate with lean proteins, such as fish, chicken without skin, beans, eggs, and tofu.  Avoid premade and processed foods. These tend to be higher in sodium, added sugar, and fat.  Reduce your daily sodium intake. Most people with hypertension should eat less than 1,500 mg of sodium a day.  Limit alcohol intake to no more than 1 drink a day for nonpregnant women and 2 drinks a day for men. One drink equals 12 oz of beer, 5 oz of wine, or 1 oz of hard liquor. Lifestyle   Work with your health care provider to maintain a healthy body weight or to lose weight. Ask what an ideal weight is for you.  Get at least 30 minutes of exercise that causes your heart to beat faster (aerobic exercise) most days of the week. Activities may include walking, swimming, or biking.  Include exercise to strengthen your muscles (resistance exercise), such as pilates or lifting weights, as part of your weekly exercise routine. Try to do these types of exercises for 30 minutes at least 3  days a week.  Do not use any products that contain nicotine or tobacco, such as cigarettes and e-cigarettes. If you need help quitting, ask your health care provider.  Monitor your blood pressure at home as told by your health care provider.  Keep all follow-up visits as told by your health care provider. This is important. Medicines   Take over-the-counter and prescription medicines only as told by your health care provider. Follow directions carefully. Blood pressure medicines must be taken as prescribed.  Do not skip doses of blood pressure medicine. Doing this puts you at risk for problems and can make the medicine less effective.  Ask your health care provider about side effects or reactions to medicines that you should watch for. Contact a health care provider if:  You think you are having a reaction to a medicine you are taking.  You have headaches that keep coming back (recurring).  You feel dizzy.  You have swelling in your ankles.  You have trouble with your vision. Get help right away if:  You develop a severe headache or confusion.  You have unusual weakness or numbness.  You feel faint.  You have severe pain in your chest or abdomen.  You vomit repeatedly.  You have trouble breathing. Summary  Hypertension is when the force of blood pumping through your arteries is too strong. If this condition is not controlled, it may put you at risk for serious complications.  Your personal target blood pressure may vary depending on your medical conditions, your age, and other factors. For most people, a normal blood pressure is less than 120/80.  Hypertension is treated with lifestyle changes, medicines, or a combination of both. Lifestyle changes include weight loss, eating a healthy, low-sodium diet, exercising more, and limiting alcohol. This information is not intended to replace advice given to you by your health care provider. Make sure you discuss any questions  you have with your health care provider. Document Released: 06/08/2005 Document Revised: 05/06/2016 Document Reviewed: 05/06/2016 Elsevier Interactive Patient Education  2017 [employer] Maintenance, Male A healthy lifestyle and preventive care is important for your health and wellness. Ask your health care provider about what schedule of regular examinations is right for you. What should I know about weight and diet?  Eat a Healthy Diet  Eat plenty of vegetables, fruits, whole grains, low-fat dairy products, and lean protein.  Do not eat a lot of foods high in solid fats, added sugars, or salt. Maintain a Healthy Weight  Regular exercise can help you achieve or maintain a healthy weight. You should:  Do at least 150 minutes of exercise each week. The exercise should increase your heart rate and make you sweat (moderate-intensity exercise).  Do strength-training exercises at least twice a week. Watch Your Levels of Cholesterol and Blood Lipids  Have your blood tested for lipids and cholesterol every 5 years starting at 56 years of age. If you are at high risk for heart disease, you should start having your blood tested when you are 56 years old. You may need to have your cholesterol levels checked more often if:  Your lipid or cholesterol levels are high.  You are older than 56 years of age.  You are at high risk for heart disease. What should I know about cancer screening? Many types of cancers can be detected early and may often be prevented. Lung Cancer  You should be screened every year for lung cancer if:  You are a current smoker who has smoked for at least 30 years.  You are a former smoker who has quit within the past 15 years.  Talk to your health care provider about your screening options, when you should start screening, and how often you should be screened. Colorectal Cancer  Routine colorectal cancer screening usually begins at 56 years of age and  should be repeated every 5-10 years until you are 56 years old. You may need to be screened more often if early forms of precancerous polyps or small growths are found. Your health care provider may recommend screening at an earlier age if you have risk factors for colon cancer.  Your health care provider may recommend using home test kits to check for hidden blood in the stool.  A small camera at the end of a tube can be used to examine your colon (sigmoidoscopy or colonoscopy). This checks for the earliest forms of colorectal cancer. Prostate and Testicular Cancer  Depending on your age and overall health, your health care provider may do  certain tests to screen for prostate and testicular cancer.  Talk to your health care provider about any symptoms or concerns you have about testicular or prostate cancer. Skin Cancer  Check your skin from head to toe regularly.  Tell your health care provider about any new moles or changes in moles, especially if:  There is a change in a mole's size, shape, or color.  You have a mole that is larger than a pencil eraser.  Always use sunscreen. Apply sunscreen liberally and repeat throughout the day.  Protect yourself by wearing long sleeves, pants, a wide-brimmed hat, and sunglasses when outside. What should I know about heart disease, diabetes, and high blood pressure?  If you are 81-11 years of age, have your blood pressure checked every 3-5 years. If you are 79 years of age or older, have your blood pressure checked every year. You should have your blood pressure measured twice-once when you are at a hospital or clinic, and once when you are not at a hospital or clinic. Record the average of the two measurements. To check your blood pressure when you are not at a hospital or clinic, you can use:  An automated blood pressure machine at a pharmacy.  A home blood pressure monitor.  Talk to your health care provider about your target blood  pressure.  If you are between 66-51 years old, ask your health care provider if you should take aspirin to prevent heart disease.  Have regular diabetes screenings by checking your fasting blood sugar level.  If you are at a normal weight and have a low risk for diabetes, have this test once every three years after the age of 44.  If you are overweight and have a high risk for diabetes, consider being tested at a younger age or more often.  A one-time screening for abdominal aortic aneurysm (AAA) by ultrasound is recommended for men aged 51-75 years who are current or former smokers. What should I know about preventing infection? Hepatitis B  If you have a higher risk for hepatitis B, you should be screened for this virus. Talk with your health care provider to find out if you are at risk for hepatitis B infection. Hepatitis C  Blood testing is recommended for:  Everyone born from [birth year] through 1965.  Anyone with known risk factors for hepatitis C. Sexually Transmitted Diseases (STDs)  You should be screened each year for STDs including gonorrhea and chlamydia if:  You are sexually active and are younger than 56 years of age.  You are older than 56 years of age and your health care provider tells you that you are at risk for this type of infection.  Your sexual activity has changed since you were last screened and you are at an increased risk for chlamydia or gonorrhea. Ask your health care provider if you are at risk.  Talk with your health care provider about whether you are at high risk of being infected with HIV. Your health care provider may recommend a prescription medicine to help prevent HIV infection. What else can I do?  Schedule regular health, dental, and eye exams.  Stay current with your vaccines (immunizations).  Do not use any tobacco products, such as cigarettes, chewing tobacco, and e-cigarettes. If you need help quitting, ask your health care provider.  Limit  alcohol intake to no more than 2 drinks per day. One drink equals 12 ounces of beer, 5 ounces of wine, or 1 ounces of hard liquor.  Do not use street drugs.  Do not share needles.  Ask your health care provider for help if you need support or information about quitting drugs.  Tell your health care provider if you often feel depressed.  Tell your health care provider if you have ever been abused or do not feel safe at home. This information is not intended to replace advice given to you by your health care provider. Make sure you discuss any questions you have with your health care provider. Document Released: 12/05/2007 Document Revised: 02/05/2016 Document Reviewed: 03/12/2015 Elsevier Interactive Patient Education  2017 [employer].

## 2016-09-02 NOTE — Assessment & Plan Note (Signed)
Patient is planning to see Dr. Melvyn Novas later this month. 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 Continue Dulera. Continue Zantac, I recommended switching to Nexium however patient's hesitant to go back on this medication.

## 2016-09-02 NOTE — Telephone Encounter (Signed)
[facility] and spoke to [facility] told him pt was told needs order for blood pressure cuff to be dispensed. AJ said he can take order, verbal order given for one blood pressure cuff to be dispensed, take blood pressure daily and prn. AJ verbalized understanding.

## 2016-09-02 NOTE — Assessment & Plan Note (Signed)
Currently asymptomatic. I will repeat CBC today, as well as a folate, B12, reticulocyte count. He is planning to have a colonoscopy within the next 2 months.

## 2016-09-02 NOTE — Progress Notes (Addendum)
Subjective:    Patient ID: Christopher Steele, male    DOB: 12-18-1960, 56 y.o.   MRN: [id number]  HPI  [name] is a 56 y/o male who presents to clinic today for physical exam.  Acute Concerns: Low hemoglobin - CBC on 08/18/2016 revealed low hemoglobin of 11.8 and low hematocrit of 35.8. Additional tests were performed and iron was found to be normal,transferrin was normal, and saturation ratio was decreased at 15.4. He denies any overt rectal bleeding. He denies history of anemia, however 1 year ago prior labs show slightly decreased hemoglobin of 12.8. Denies dizziness, lightheadedness, tachycardia. Community acquired PNA - patient was diagnosed on 08/18/2016 via chest x-ray. He has completed his antibiotic, doxycycline. He continues to have chronic cough, but denies fever, chills, sputum production. He is using his Dulera inhaler as prescribed and this helps if he has any shortness of breath. Elevated blood pressure reading - blood pressures are as follows. He denies any chest pain, unusual leg swelling, palpitations, blurred vision, headache. He denies any prior history of hypertension and has never been on medications for this. He does report that his wife is concerned he has sleep apnea, and he is planning to discuss this further with Dr. Melvyn Novas later this month.  BP Readings from Last 3 Encounters:  09/02/16 (!) 162/88  08/18/16 (!) 146/98  05/30/15 139/87   Chronic Concerns: Chronic cough --  he has a history of being followed by Dr. Melvyn Novas in pulmonology, and plans to follow up with him later this month for this chronic cough and shortness of breath. He is still very reluctant to take Nexium because of something that he heard on TV about this medicine. Although he states that when he was on Nexium his symptoms were much better controlled. He is still on Zantac for this which is not effective. He does not take the Zantac 30 minutes prior to meals.  Morbid obesity -- current Body mass index is  46.11 kg/m. Limited exercise outside of work. Diet is variable.  Health Maintenance: Immunizations -- Tdap today,  Colonoscopy -- has never had one, has appointment in April for this. Diet -- diet is variable, eats from all food groups. He does drink Gatorade daily and this is the only beverages that he drinks. Exercise -- he does a lot of walking. Weight -- Weight: (!) 303 lb 4 oz (137.6 kg)  Mood -- "good"  Other providers/specialists: No dentist Goes to an eye doctor, picking up new glasses Rx soon  Review of Systems  Constitutional: Negative for activity change, appetite change, fatigue and fever.  HENT: Negative for hearing loss, postnasal drip and sore throat.   Eyes: Negative for pain, redness and visual disturbance.  Respiratory: Negative for cough, chest tightness and shortness of breath.   Cardiovascular: Negative for chest pain, palpitations and leg swelling.  Gastrointestinal: Negative for constipation, diarrhea, nausea and vomiting.  Endocrine: Negative for polydipsia, polyphagia and polyuria.  Genitourinary: Negative for decreased urine volume, difficulty urinating, dysuria and frequency.  Musculoskeletal: Negative for joint swelling and myalgias.  Skin: Negative for rash.  Neurological: Negative for dizziness, syncope and numbness.  Hematological: Negative for adenopathy.  Psychiatric/Behavioral: Negative for dysphoric mood. The patient is not nervous/anxious.    No past medical history on file.   Social History   Social History  . Marital status: Married    Spouse name: N/A  . Number of children: N/A  . Years of education: N/A   Occupational History  .  Not on file.   Social History Main Topics  . Smoking status: Never Smoker  . Smokeless tobacco: Never Used  . Alcohol use Yes     Comment: rarely  . Drug use: No  . Sexual activity: No   Other Topics Concern  . Not on file   Social History Narrative   Works 3rd shift, in maintenance   Married     Librarian, academic for the [employer]    Children -- 2 children,  12 and 74 -- both live with them, [name]    Past Surgical History:  Procedure Laterality Date  . 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 THROAT SURGERY     polyp    Family History  Problem Relation Age of Onset  . Asthma Mother   . Heart disease Father     No Known Allergies  Current Outpatient Prescriptions on File Prior to Visit  Medication Sig Dispense Refill  . albuterol (PROVENTIL) (2.5 MG/3ML) 0.083% nebulizer solution Take 3 mLs (2.5 mg total) by nebulization every 6 (six) hours as needed for wheezing or shortness of breath. 75 mL 2  . mometasone-formoterol (DULERA) 100-5 MCG/ACT AERO Inhale 2 puffs into the lungs every 12 (twelve) hours as needed for wheezing. 1 Inhaler 2   No current facility-administered medications on file prior to visit.     BP (!) 162/88 (BP Location: Left Arm, Cuff Size: Large)   Pulse 73   Temp 98.4 F (36.9 C) (Oral)   Ht 5\' 8"  (1.727 m)   Wt (!) 303 lb 4 oz (137.6 kg)   SpO2 95%   BMI 46.11 kg/m      Objective:   Physical Exam  Constitutional: He is oriented to person, place, and time. Vital signs are normal. He appears well-developed and well-nourished.  HENT:  Head: Normocephalic and atraumatic.  Right Ear: Tympanic membrane, external ear and ear canal normal. Tympanic membrane is not erythematous, not retracted and not bulging.  Left Ear: Tympanic membrane, external ear and ear canal normal. Tympanic membrane is not erythematous, not retracted and not bulging.  Nose: Nose normal.  Mouth/Throat: Uvula is midline. No posterior oropharyngeal edema or posterior oropharyngeal erythema.  Eyes: Pupils are equal, round, and reactive to light.  Neck: Normal range of motion. Neck supple.  Cardiovascular: Normal rate, regular rhythm, normal heart sounds and intact distal pulses.   Lower leg edema at mid-calf, impression of socks evident  Pulmonary/Chest: Effort normal and breath  sounds normal.  Abdominal: Soft. Normal appearance and bowel sounds are normal. He exhibits no distension and no mass. There is no tenderness. There is no rebound and no guarding.  Genitourinary: Rectum normal and prostate normal. Rectal exam shows guaiac negative stool.  Musculoskeletal: Normal range of motion.  Area of prominent adipose at LUQ, not tender to touch, no erythema  Lymphadenopathy:    He has no cervical adenopathy.  Neurological: He is alert and oriented to person, place, and time. He has normal reflexes. No cranial nerve deficit. Coordination normal.  Skin: Skin is warm and dry.  Psychiatric: He has a normal mood and affect. His behavior is normal. Judgment and thought content normal.  Nursing note and vitals reviewed.     Assessment & Plan:   Problem List Items Addressed This Visit      Cardiovascular and Mediastinum   Essential hypertension    Patient meets criteria for hypertension at today's visit. This is a new diagnosis for patient. Will start 5 mg Norvasc.  Patient to follow-up with [place] in 1 month for nurse blood pressure check. I recommended patient buy a blood pressure machine and take blood pressure daily, bring results to [place] at follow-up. I suspect that patient has sleep apnea and this is contributing to this. Heart Healthy diet.      Relevant Medications   amLODipine (NORVASC) 5 MG tablet     Digestive   GERD (gastroesophageal reflux disease) - Primary    Patient continues on Zantac. He is reluctant to resume Nexium because of something he heard about the medication, potential side effect, he does not remove what this is. He does note that Nexium was more effective for him and control his symptoms. I recommend that when he follows up with Dr. Melvyn Novas that he asked about the safety of this medication.      Relevant Medications   ranitidine (ZANTAC) 300 MG tablet     Other   Chronic cough    Patient is planning to see Dr. Melvyn Novas later this month. 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 Continue Dulera. Continue Zantac, I recommended switching to Nexium however patient's hesitant to go back on this medication.      Low hemoglobin    Currently asymptomatic. I will repeat CBC today, as well as a folate, B12, reticulocyte count. He is planning to have a colonoscopy within the next 2 months.      Relevant Orders   CBC with Differential/Platelet   Vitamin B12   Folate   Retic    Other Visit Diagnoses    Encounter for general adult medical examination with abnormal findings     Today patient counseled on age appropriate routine health concerns for screening and prevention, each reviewed and up to date or declined. Immunizations reviewed and up to date or declined. Labs ordered and reviewed. Risk factors for depression reviewed and negative. Hearing function and visual acuity are intact. ADLs screened and addressed as needed. Functional ability and level of safety reviewed and appropriate. Education, counseling and referrals performed based on assessed risks today. Patient provided with a copy of personalized plan for preventive services. Patient is agreeable to one-time screening of HIV and hep C. We discussed risks and benefits of PSA screening. He would like PSA screening today.   Relevant Orders   Lipid panel   CBC with Differential/Platelet   HIV antibody   Hepatitis C Antibody   PSA   Need for prophylactic vaccination with combined diphtheria-tetanus-pertussis (DTP) vaccine       Relevant Orders   Tdap vaccine greater than or equal to 7yo IM (Completed)   Community acquired pneumonia of right lower lobe of lung ([place])     Stable. I would like to repeat a chest x-ray at approximately 1 month from now. Continue Dulera. Let us know if any symptoms worsen or fevers develop.   BMI 45 - 49.9 Discussed healthier eating and to focus on heart healthy diet. Encouraged activity as able.     [name] PA-C  09/02/16

## 2016-09-02 NOTE — Assessment & Plan Note (Signed)
Patient continues on Zantac. He is reluctant to resume Nexium because of something he heard about the medication, potential side effect, he does not remove what this is. He does note that Nexium was more effective for him and control his symptoms. I recommend that when he follows up with Dr. Melvyn Novas that he asked about the safety of this medication.

## 2016-09-02 NOTE — Telephone Encounter (Signed)
Pt calling to ask if he has refills on Albuterol for his nebulizer and needs Rx for blood pressure cuff to be called into the pharmacy. Told pt there is refills on Albuterol solution and I will call pharmacy and give order for blood pressure cuff. Pt verbalized understanding.

## 2016-09-02 NOTE — Progress Notes (Signed)
Pre visit review using our clinic review tool, if applicable. No additional management support is needed unless otherwise documented below in the visit note. 

## 2016-09-03 LAB — HEPATITIS C ANTIBODY: HCV AB: NEGATIVE

## 2016-09-03 LAB — HIV ANTIBODY (ROUTINE TESTING W REFLEX): HIV 1&2 Ab, 4th Generation: NONREACTIVE

## 2016-09-07 ENCOUNTER — Telehealth: Payer: Self-pay | Admitting: Physician Assistant

## 2016-09-07 NOTE — Telephone Encounter (Signed)
Please contact Christopher Steele regarding some medication contradiction questions. He also needs to know if he should take his medication prior to his visit.  Thank you,  -LL

## 2016-09-07 NOTE — Telephone Encounter (Signed)
Spoke to pt, asked him how can I help you? Pt wanted to know if he can take medication prior to appointment with Dr. Melvyn Novas tomorrow? Told pt yes that is fine. Also wanted to know if he should check blood pressure before taking Amlodipine? Told pt yes, take blood pressure first and then take medication.  Pt verbalized understanding. Answered all pt's questions and he verbalized understanding. Reminded pt needs to schedule B12 Injections. Pt verbalized understanding and will call back to schedule.

## 2016-09-08 ENCOUNTER — Encounter: Payer: Self-pay | Admitting: Internal Medicine

## 2016-09-08 ENCOUNTER — Ambulatory Visit (INDEPENDENT_AMBULATORY_CARE_PROVIDER_SITE_OTHER): Payer: BLUE CROSS/BLUE SHIELD | Admitting: Internal Medicine

## 2016-09-08 VITALS — BP 124/80 | HR 84 | Ht 68.0 in | Wt 290.0 lb

## 2016-09-08 DIAGNOSIS — G4733 Obstructive sleep apnea (adult) (pediatric): Secondary | ICD-10-CM

## 2016-09-08 DIAGNOSIS — J4530 Mild persistent asthma, uncomplicated: Secondary | ICD-10-CM

## 2016-09-08 MED ORDER — BUDESONIDE-FORMOTEROL FUMARATE 160-4.5 MCG/ACT IN AERO
INHALATION_SPRAY | RESPIRATORY_TRACT | 12 refills | Status: DC
Start: 2016-09-08 — End: 2016-10-12

## 2016-09-08 NOTE — Progress Notes (Signed)
Subjective:     Patient ID: Christopher Steele, male   DOB: 08/24/1960,    MRN: [id number]  HPI   39   yowm never smoker with new onset doe x 2006 with uacs vs asthma      History of Present Illness  May 21, 2010 1st pulmonary office eval cc doe indolent onset sob/ dry cough p throat [place] polyps in 2006 [place] moved to [place] 2007 assoc with cough and congestion worse when lie down. presently doe x heavy exertion like jogging. ventolin helps the most mucus is clear  Rec Please schedule a follow-up appointment in 4weeks, sooner if needed with PFT's -first week in January is ok  2) Dulera 100 2 puffs every 12 hours as needed  3) start nexium 40 mg Take one 30-60 min before first meal and pepcid 20 mg at bedtime  4) GERD diet    09/28/2011 f/u ov/Christopher Steele ran out of dulera x one month, prior to that doing great despite only using the dulera maybe once or twice daily, and not needing saba much at all daytime. Admits not adhering to diet or gerd rx either and more sob vs p last ov but no excess cough/ sputum production or overt sinus or reflux symptoms rec Stay on nexium 40mg  x one pill 30 min before your first meal and pecid 20 mg one at bedtime until return GERD diet  Only use dulera 100 2 puffs every 12 hours if you're still having breathing problem     09/08/2016   ov/Christopher Steele re: reestablish re cough/sob ? Asthma plus MO/ ? osa  Chief Complaint  Patient presents with  . Pulmonary Consult    Referred by Dr. Morene Rankins for eval of Asthma and also OSA. Pt c/o cough and SOB when he wakes up in the am, and occ throughout the day. His cough is occ prod with white sputum. His spouse tells him he snores and stops breathing during sleep.    on avg dulera need  increased p stopped gerd rx sev months prior to OV   due to concerns for  Side effects from ppi reported on internet While on gerd rx still required  dulera but was able to skip doses and now off dulera needing saba up to 4 x daily and  noct as well   No obvious day to day or daytime variability or assoc excess/ purulent sputum or mucus plugs or hemoptysis or cp or chest tightness, subjective wheeze or overt sinus or hb symptoms. No unusual exp hx or h/o childhood pna/ asthma or knowledge of premature birth.  Also denies any obvious fluctuation of symptoms with weather or environmental changes or other aggravating or alleviating factors except as outlined above   Current Medications, Allergies, Complete Past Medical History, Past Surgical History, Family History, and Social History were reviewed in [employer] record.  ROS  The following are not active complaints unless bolded sore throat, dysphagia, dental problems, itching, sneezing,  nasal congestion or excess/ purulent secretions, ear ache,   fever, chills, sweats, unintended wt loss, classically pleuritic or exertional cp,  orthopnea pnd or leg swelling, presyncope, palpitations, abdominal pain, anorexia, nausea, vomiting, diarrhea  or change in bowel or bladder habits, change in stools or urine, dysuria,hematuria,  rash, arthralgias, visual complaints, headache, numbness, weakness or ataxia or problems with walking or coordination,  change in mood/affect or memory.         Epworth score = 8 (mostly sitting p  eating watching TV in afternoon or evening, never driving)     Past Medical History:  ? 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 Spouse and co workers all smoke  Maintenence work  ETOH on [employer] Exposure: yes     Review of Systems     Objective:   Physical Exam amb obese bm nad   Wt Readings from Last 3 Encounters:  09/08/16 290 lb (131.5 kg)   09/02/16 (!) 303 lb 4 oz (137.6 kg)  08/18/16 297 lb 4 oz (134.8 kg)   wt 304 May 21, 2010 > 09/28/2011  307   Vital signs reviewed - Note on arrival 02 sats  98% on RA    HEENT: nl dentition, turbinates bilaterally, and oropharynx. Nl external ear canals without cough reflex Modified Mallampati Score =   2   NECK :  without JVD/Nodes/TM/ nl carotid upstrokes bilaterally   LUNGS: no acc muscle use,  Nl contour chest which is clear to A and P bilaterally without cough on insp or exp maneuvers   CV:  RRR  no s3 or murmur or increase in P2, and no edema   ABD:  soft and nontender with nl inspiratory excursion in the supine position. No bruits or organomegaly appreciated, bowel sounds nl  MS:  Nl gait/ ext warm without deformities, calf tenderness, cyanosis or clubbing No obvious joint restrictions   SKIN: warm and dry without lesions    NEURO:  alert, approp, nl sensorium with  no motor or cerebellar deficits apparent.   CXR PA and Lateral:   09/08/2016 :    I personally reviewed images and agree with radiology impression as follows:        Assessment:

## 2016-09-08 NOTE — Patient Instructions (Addendum)
Plan A = Automatic = Symbicort 160 Take 2 puffs first thing in am and then another 2 puffs about 12 hours later.    Plan B = Backup Only use your albuterol as a rescue medication to be used if you can't catch your breath by resting or doing a relaxed purse lip breathing pattern.  - The less you use it, the better it will work when you need it. - Ok to use the inhaler up to 2 puffs  every 4 hours if you must but call for appointment if use goes up over your usual need - Don't leave home without it !!  (think of it like the spare tire for your car)   Ranitidine  300  mg at bedtime   GERD (REFLUX)  is an extremely common cause of respiratory symptoms just like yours , many times with no obvious heartburn at all.    It can be treated with medication, but also with lifestyle changes including elevation of the head of your bed (ideally with 6 inch  bed blocks),  Smoking cessation, avoidance of late meals, excessive alcohol, and avoid fatty foods, chocolate, peppermint, colas, red wine, and acidic juices such as orange juice.  NO MINT OR MENTHOL PRODUCTS SO NO COUGH DROPS   USE SUGARLESS CANDY INSTEAD (Jolley ranchers or Stover's or Life Savers) or even ice chips will also do - the key is to swallow to prevent all throat clearing. NO OIL BASED VITAMINS - use powdered substitutes.    Please schedule a follow up office visit in 4 weeks, sooner if needed  with all medications /inhalers/ solutions in hand so we can verify exactly what you are taking. This includes all medications from all doctors and over the counters

## 2016-09-09 DIAGNOSIS — G4733 Obstructive sleep apnea (adult) (pediatric): Secondary | ICD-10-CM | POA: Insufficient documentation

## 2016-09-09 NOTE — Assessment & Plan Note (Signed)
epworth score 09/08/2016 = 8  - pt declined osa study/treatment options  Discussed in detail all the  indications, usual  risks and alternatives  relative to the benefits with patient who agrees to proceed with wt loss/ staying off back, schedule PSS at his discretion if not improving

## 2016-09-09 NOTE — Assessment & Plan Note (Signed)
Body mass index is 44.09 kg/m.  trending down/ encouraged No results found for: TSH   Contributing to gerd risk/ doe/reviewed the need and the process to achieve and maintain neg calorie balance > defer f/u primary care including intermittently monitoring thyroid status

## 2016-09-09 NOTE — Assessment & Plan Note (Signed)
07/03/10; PFT's FET1 1.86 (50%) ratop 57 > nl p B2 (68% better) with DLCO nl - 09/08/2016  After extensive coaching HFA effectiveness =    75 % > try symbicort 160 2bid   He reports that when his gerd is well controlled his asthma does not bother his as much but based on previous pfts needs a trial of max ics/laba to see if we can normalize his baseline lung function and reduce his dep on saba and in meantime just use h2 hs and diet restrictions for now  Total time devoted to counseling  > 50 % of initial 60 min office visit:  review case with pt/ discussion of options/alternatives/ personally creating written customized instructions  in presence of pt  then going over those specific  Instructions directly with the pt including how to use all of the meds but in particular covering each new medication in detail and the difference between the maintenance= "automatic" meds and the prns using an action plan format for the latter (If this problem/symptom => do that organization reading Left to right).  Please see AVS from this visit for a full list of these instructions which I personally wrote for this pt and  are unique to this visit.

## 2016-09-11 ENCOUNTER — Ambulatory Visit (INDEPENDENT_AMBULATORY_CARE_PROVIDER_SITE_OTHER): Payer: BLUE CROSS/BLUE SHIELD | Admitting: *Deleted

## 2016-09-11 DIAGNOSIS — E538 Deficiency of other specified B group vitamins: Secondary | ICD-10-CM

## 2016-09-11 MED ORDER — CYANOCOBALAMIN 1000 MCG/ML IJ SOLN
1000.0000 ug | Freq: Once | INTRAMUSCULAR | Status: AC
  Administered 2016-09-11: 1000 ug via INTRAMUSCULAR

## 2016-09-21 ENCOUNTER — Encounter: Payer: Self-pay | Admitting: Physician Assistant

## 2016-09-21 ENCOUNTER — Ambulatory Visit (INDEPENDENT_AMBULATORY_CARE_PROVIDER_SITE_OTHER): Payer: BLUE CROSS/BLUE SHIELD | Admitting: Physician Assistant

## 2016-09-21 DIAGNOSIS — E538 Deficiency of other specified B group vitamins: Secondary | ICD-10-CM | POA: Diagnosis not present

## 2016-09-21 MED ORDER — CYANOCOBALAMIN 1000 MCG/ML IJ SOLN
1000.0000 ug | Freq: Once | INTRAMUSCULAR | Status: AC
  Administered 2016-09-21: 1000 ug via INTRAMUSCULAR

## 2016-09-21 NOTE — Progress Notes (Signed)
Pt was given 2nd Vitamin B12 injection today and tolerated well. Injection given in Left deltoid.

## 2016-09-28 ENCOUNTER — Ambulatory Visit: Payer: BLUE CROSS/BLUE SHIELD

## 2016-09-28 ENCOUNTER — Ambulatory Visit (INDEPENDENT_AMBULATORY_CARE_PROVIDER_SITE_OTHER): Payer: BLUE CROSS/BLUE SHIELD | Admitting: *Deleted

## 2016-09-28 DIAGNOSIS — E538 Deficiency of other specified B group vitamins: Secondary | ICD-10-CM

## 2016-09-28 MED ORDER — CYANOCOBALAMIN 1000 MCG/ML IJ SOLN
1000.0000 ug | Freq: Once | INTRAMUSCULAR | Status: AC
  Administered 2016-09-28: 1000 ug via INTRAMUSCULAR

## 2016-09-28 NOTE — Progress Notes (Signed)
Pt here for 3rd B12 injection, tolerated well. Pt knows to return next Monday for 4th injection.

## 2016-10-02 ENCOUNTER — Telehealth: Payer: Self-pay | Admitting: Physician Assistant

## 2016-10-02 ENCOUNTER — Ambulatory Visit (INDEPENDENT_AMBULATORY_CARE_PROVIDER_SITE_OTHER): Payer: BLUE CROSS/BLUE SHIELD

## 2016-10-02 VITALS — BP 120/80 | Wt 276.0 lb

## 2016-10-02 DIAGNOSIS — I10 Essential (primary) hypertension: Secondary | ICD-10-CM

## 2016-10-02 MED ORDER — CYANOCOBALAMIN 1000 MCG/ML IJ SOLN: 1000.0000 ug | INTRAMUSCULAR | 0 refills | Status: DC

## 2016-10-02 MED ORDER — "SYRINGE/NEEDLE (DISP) 25G X 1"" 3 ML MISC": 0 refills | Status: DC

## 2016-10-02 NOTE — Telephone Encounter (Signed)
This is fine 

## 2016-10-02 NOTE — Progress Notes (Signed)
Pt presents for a blood pressure check today. Reading reported to [facility], [place] and pt has been scheduled a follow up appointment.

## 2016-10-02 NOTE — Telephone Encounter (Signed)
Patient called asking to be given a prescription for the B12 so he can administer at home himself.

## 2016-10-02 NOTE — Telephone Encounter (Signed)
Spoke to pt, told him Rx's for Vit B12 and needles and syringes sent to pharmacy. Also told him next injection is due on Monday 16th and then you go to monthly injections which would be May 14th. Pt verbalized understanding.Rx's sent to pharmacy.

## 2016-10-02 NOTE — Addendum Note (Signed)
Addended by: Marian Sorrow on: 10/02/2016 11:47 AM   Modules accepted: Orders

## 2016-10-02 NOTE — Telephone Encounter (Signed)
Christopher Steele, please see message and advise if okay to order.

## 2016-10-05 ENCOUNTER — Telehealth: Payer: Self-pay | Admitting: Physician Assistant

## 2016-10-05 NOTE — Telephone Encounter (Signed)
Patient called wanting to speak with provider regarding his health. OK to leave VM if he does not answer.

## 2016-10-05 NOTE — Telephone Encounter (Signed)
Spoke with patient and clarified with him about his B12 injections. He verbalized my understanding and will keep his follow up appointments. He went to urgent care today with SOB--they did an EKG and he will reach out to them so they can fax [place] the records. Will leave active so I can remind him tomorrow.

## 2016-10-05 NOTE — Telephone Encounter (Signed)
Patient refused to give additional information about "health questions" he had.

## 2016-10-07 NOTE — Progress Notes (Signed)
I reviewed patient's blood pressure and discussed plan of care with CMA, Jari Sportsman.   [name] PA-C

## 2016-10-07 NOTE — Telephone Encounter (Signed)
Pt is aware to contact urgent care facility to have them fax over records.

## 2016-10-12 ENCOUNTER — Ambulatory Visit (AMBULATORY_SURGERY_CENTER): Payer: Self-pay | Admitting: *Deleted

## 2016-10-12 ENCOUNTER — Encounter: Payer: Self-pay | Admitting: Internal Medicine

## 2016-10-12 ENCOUNTER — Ambulatory Visit (INDEPENDENT_AMBULATORY_CARE_PROVIDER_SITE_OTHER): Payer: BLUE CROSS/BLUE SHIELD | Admitting: Internal Medicine

## 2016-10-12 ENCOUNTER — Encounter: Payer: Self-pay | Admitting: Gastroenterology

## 2016-10-12 VITALS — Ht 68.0 in | Wt 279.0 lb

## 2016-10-12 VITALS — BP 128/82 | HR 75 | Ht 68.0 in | Wt 279.6 lb

## 2016-10-12 DIAGNOSIS — J4530 Mild persistent asthma, uncomplicated: Secondary | ICD-10-CM

## 2016-10-12 DIAGNOSIS — Z1211 Encounter for screening for malignant neoplasm of colon: Secondary | ICD-10-CM

## 2016-10-12 MED ORDER — BUDESONIDE-FORMOTEROL FUMARATE 160-4.5 MCG/ACT IN AERO: 2.0000 | INHALATION_SPRAY | Freq: Two times a day (BID) | RESPIRATORY_TRACT | 0 refills | Status: DC

## 2016-10-12 MED ORDER — BUDESONIDE-FORMOTEROL FUMARATE 160-4.5 MCG/ACT IN AERO: 2.0000 | INHALATION_SPRAY | Freq: Two times a day (BID) | RESPIRATORY_TRACT | 11 refills | Status: DC

## 2016-10-12 MED ORDER — NA SULFATE-K SULFATE-MG SULF 17.5-3.13-1.6 GM/177ML PO SOLN: 1.0000 | Freq: Once | ORAL | 0 refills | Status: DC

## 2016-10-12 NOTE — Progress Notes (Signed)
No egg or soy allergy known to patient  No issues with past sedation with any surgeries  or procedures, no intubation problems  No diet pills per patient No home 02 use per patient  No blood thinners per patient  Pt denies issues with constipation  No A fib or A flutter  No e mail per pt for  EMMI video

## 2016-10-12 NOTE — Progress Notes (Signed)
Subjective:     Patient ID: Christopher Steele, male   DOB: [birth year],    MRN: [id number]  HPI   45   yowm never smoker with new onset doe x 2006 with uacs vs asthma      History of Present Illness  May 21, 2010 1st pulmonary office eval cc doe indolent onset sob/ dry cough p throat surgery for polyps in 2006 from [place] moved to [place] 2007 assoc with cough and congestion worse when lie down. presently doe x heavy exertion like jogging. ventolin helps the most mucus is clear  Rec Please schedule a follow-up appointment in 4weeks, sooner if needed with PFT's -first week in January is ok  2) Dulera 100 2 puffs every 12 hours as needed  3) start nexium 40 mg Take one 30-60 min before first meal and pepcid 20 mg at bedtime  4) GERD diet    09/28/2011 f/u ov/Christopher Steele ran out of dulera x one month, prior to that doing great despite only using the dulera maybe once or twice daily, and not needing saba much at all daytime. Admits not adhering to diet or gerd rx either and more sob vs p last ov but no excess cough/ sputum production or overt sinus or reflux symptoms rec Stay on nexium 40mg  x one pill 30 min before your first meal and pecid 20 mg one at bedtime until return GERD diet  Only use dulera 100 2 puffs every 12 hours if you're still having breathing problem     09/08/2016   ov/Christopher Steele re: reestablish re cough/sob ? Asthma plus MO/ ? osa  Chief Complaint  Patient presents with  . Pulmonary Consult    Referred by Dr. Morene Rankins for eval of Asthma and also OSA. Pt c/o cough and SOB when he wakes up in the am, and occ throughout the day. His cough is occ prod with white sputum. His spouse tells him he snores and stops breathing during sleep.    on avg dulera need  increased p stopped gerd rx sev months prior to OV   due to concerns for  Side effects from ppi reported on internet While on gerd rx still required  dulera but was able to skip doses and now off dulera needing saba up to 4 x daily and  noct as well  rec Plan A = Automatic = Symbicort 160 Take 2 puffs first thing in am and then another 2 puffs about 12 hours later.  Plan B = Backup Only use your albuterol as a rescue medication - Don't leave home without it !!  (think of it like the spare tire for your car)  Ranitidine  300  mg at bedtime  GERD   Please schedule a follow up office visit in 4 weeks, sooner if needed  with all medications /inhalers/ solutions in hand so we can verify exactly what you are taking. This includes all medications from all doctors and over the counters    10/12/2016  f/u ov/Christopher Steele re: persistent asthma/ non-adherence  Chief Complaint  Patient presents with  . Follow-up    Breathing has improved back to his normal baseline. He states he is no longer coughing or wheezing. He has not had to use his proair. No new co's today.       Confused with meds/ instructions. Has empty symbicort and using proair once daily instead not as prn  Also zantac 300 mg before bfast    No obvious day to day or  daytime variability or assoc excess/ purulent sputum or mucus plugs or hemoptysis or cp or chest tightness, subjective wheeze or overt sinus or hb symptoms. No unusual exp hx or h/o childhood pna/ asthma or knowledge of premature birth.  Sleeping ok without nocturnal  or early am exacerbation  of respiratory  c/o's or need for noct saba. Also denies any obvious fluctuation of symptoms with weather or environmental changes or other aggravating or alleviating factors except as outlined above   Current Medications, Allergies, Complete Past Medical History, Past Surgical History, Family History, and Social History were reviewed in [employer] [facility] record.  ROS  The following are not active complaints unless bolded sore throat, dysphagia, dental problems, itching, sneezing,  nasal congestion or excess/ purulent secretions, ear ache,   fever, chills, sweats, unintended wt loss, classically pleuritic or  exertional cp,  orthopnea pnd or leg swelling, presyncope, palpitations, abdominal pain, anorexia, nausea, vomiting, diarrhea  or change in bowel or bladder habits, change in stools or urine, dysuria,hematuria,  rash, arthralgias, visual complaints, headache, numbness, weakness or ataxia or problems with walking or coordination,  change in mood/affect or memory.                     Past Medical History:  ? 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 Spouse and co workers all smoke  Maintenence work  ETOH on [employer] Exposure: yes          Objective:   Physical Exam amb obese bm nad     wt 304 May 21, 2010 >  09/28/2011  307>  10/12/2016   279     09/08/16 290 lb (131.5 kg)  09/02/16 (!) 303 lb 4 oz (137.6 kg)  08/18/16 297 lb 4 oz (134.8 kg)     Vital signs reviewed - Note on arrival 02 sats  94% on RA    HEENT: nl dentition, turbinates bilaterally, and oropharynx. Nl external ear canals without cough reflex Modified Mallampati Score =   2   NECK :  without JVD/Nodes/TM/ nl carotid upstrokes bilaterally   LUNGS: no acc muscle use,  Nl contour chest which is clear to A and P bilaterally without cough on insp or exp maneuvers   CV:  RRR  no s3 or murmur or increase in P2, and no edema   ABD:  soft and nontender with nl inspiratory excursion in the supine position. No bruits or organomegaly appreciated, bowel sounds nl  MS:  Nl gait/ ext warm without deformities, calf tenderness, cyanosis or clubbing No obvious joint restrictions   SKIN: warm and dry without lesions    NEURO:  alert, approp, nl sensorium with  no motor or cerebellar deficits apparent.     cxr 10/04/16 UC :   Ok      Assessment:

## 2016-10-12 NOTE — Patient Instructions (Addendum)
If having any cough/. Wheeze or shortness of breath ok to use the Proair but if any more than twice week immediately restart the symbicort 160 Take 2 puffs first thing in am and then another 2 puffs about 12 hours later.      Please schedule a follow up visit in 3 months but call sooner if needed  with all medications /inhalers/ solutions in hand so we can verify exactly what you are taking. This includes all medications from all doctors and over the counters

## 2016-10-13 NOTE — Assessment & Plan Note (Signed)
Mild restrictive changes on spirometry 10/12/2016   Body mass index is 42.51 kg/m.  -  trending down No results found for: TSH   Contributing to gerd risk/ doe/reviewed the need and the process to achieve and maintain neg calorie balance > defer f/u primary care including intermittently monitoring thyroid status

## 2016-10-13 NOTE — Assessment & Plan Note (Addendum)
07/03/10; PFT's FET1 1.86 (50%) ratop 57 > nl p B2 (68% better) with DLCO nl - 09/08/2016  After extensive coaching HFA effectiveness =    75 % > try symbicort 160 2bid  - Spirometry 10/12/2016  FEV1 2.27 (76%)  Ratio 77 with minimal curvature p saba w/in 4 h prior   10/12/2016  After extensive coaching HFA effectiveness =    90% from a baseline of 75%   I had an extended discussion with the patient reviewing all relevant studies completed to date and  lasting 15 to 20 minutes of a 25 minute visit on the following ongoing concerns:   Asthma is probably well controlled despite misunderstanding maint vs prns  Key is to only use the saba if symptomatic then use the rule of 2's to determine whether needs to be on maint symb 160 2bid   Each maintenance medication was reviewed in detail including most importantly the difference between maintenance and as needed and under what circumstances the prns are to be used.  Please see AVS for specific  Instructions which are unique to this visit and I personally typed out  which were reviewed in detail in writing with the patient and a copy provided.

## 2016-10-26 ENCOUNTER — Encounter: Payer: Self-pay | Admitting: Gastroenterology

## 2016-10-26 ENCOUNTER — Ambulatory Visit (AMBULATORY_SURGERY_CENTER): Payer: BLUE CROSS/BLUE SHIELD | Admitting: Gastroenterology

## 2016-10-26 VITALS — BP 106/48 | HR 58 | Temp 97.3°F | Resp 11 | Ht 68.0 in | Wt 279.0 lb

## 2016-10-26 DIAGNOSIS — Z1211 Encounter for screening for malignant neoplasm of colon: Secondary | ICD-10-CM | POA: Diagnosis present

## 2016-10-26 DIAGNOSIS — Z1212 Encounter for screening for malignant neoplasm of rectum: Secondary | ICD-10-CM | POA: Diagnosis not present

## 2016-10-26 DIAGNOSIS — D122 Benign neoplasm of ascending colon: Secondary | ICD-10-CM | POA: Diagnosis not present

## 2016-10-26 DIAGNOSIS — D123 Benign neoplasm of transverse colon: Secondary | ICD-10-CM

## 2016-10-26 MED ORDER — SODIUM CHLORIDE 0.9 % IV SOLN: 500.0000 mL | INTRAVENOUS | Status: DC

## 2016-10-26 NOTE — Patient Instructions (Signed)
Handouts given on polyps and diverticulosis  YOU HAD AN ENDOSCOPIC PROCEDURE TODAY: Refer to the procedure report and other information in the discharge instructions given to you for any specific questions about what was found during the examination. If this information does not answer your questions, please call [facility] office at [phone] to clarify.   YOU SHOULD EXPECT: Some feelings of bloating in the abdomen. Passage of more gas than usual. Walking can help get rid of the air that was put into your GI tract during the procedure and reduce the bloating. If you had a lower endoscopy (such as a colonoscopy or flexible sigmoidoscopy) you may notice spotting of blood in your stool or on the toilet paper. Some abdominal soreness may be present for a day or two, also.  DIET: Your first meal following the procedure should be a light meal and then it is ok to progress to your normal diet. A half-sandwich or bowl of soup is an example of a good first meal. Heavy or fried foods are harder to digest and may make you feel nauseous or bloated. Drink plenty of fluids but you should avoid alcoholic beverages for 24 hours. If you had a esophageal dilation, please see attached instructions for diet.    ACTIVITY: Your care partner should take you home directly after the procedure. You should plan to take it easy, moving slowly for the rest of the day. You can resume normal activity the day after the procedure however YOU SHOULD NOT DRIVE, use power tools, machinery or perform tasks that involve climbing or major physical exertion for 24 hours (because of the sedation medicines used during the test).   SYMPTOMS TO REPORT IMMEDIATELY: A gastroenterologist can be reached at any hour. Please call [phone]  for any of the following symptoms:  Following lower endoscopy (colonoscopy, flexible sigmoidoscopy) Excessive amounts of blood in the stool  Significant tenderness, worsening of abdominal pains  Swelling of  the abdomen that is new, acute  Fever of 100 or higher    FOLLOW UP:  If any biopsies were taken you will be contacted by phone or by letter within the next 1-3 weeks. Call [phone]  if you have not heard about the biopsies in 3 weeks.  Please also call with any specific questions about appointments or follow up tests.  

## 2016-10-26 NOTE — Progress Notes (Signed)
Pt's states no medical or surgical changes since previsit or office visit. Maw

## 2016-10-26 NOTE — Progress Notes (Signed)
Called to room to assist during endoscopic procedure.  Patient ID and intended procedure confirmed with present staff. Received instructions for my participation in the procedure from the performing physician.  

## 2016-10-26 NOTE — Op Note (Signed)
[facility] Patient Name: Christopher Steele Procedure Date: 10/26/2016 8:28 AM MRN: [id number] Endoscopist: Mallie Mussel L. Loletha Carrow , MD Age: 56 Referring MD:  Date of Birth: 07/23/1960 Gender: Male Account #: [id number] Procedure:                Colonoscopy Indications:              Screening for colorectal malignant neoplasm, This                            is the patient's first colonoscopy Medicines:                Monitored Anesthesia Care Procedure:                Pre-Anesthesia Assessment:                           - Prior to the procedure, a History and Physical                            was performed, and patient medications and                            allergies were reviewed. The patient's tolerance of                            previous anesthesia was also reviewed. The risks                            and benefits of the procedure and the sedation                            options and risks were discussed with the patient.                            All questions were answered, and informed consent                            was obtained. Prior Anticoagulants: The patient has                            taken no previous anticoagulant or antiplatelet                            agents. ASA Grade Assessment: II - A patient with                            mild systemic disease. After reviewing the risks                            and benefits, the patient was deemed in                            satisfactory condition to undergo the procedure.  After obtaining informed consent, the colonoscope                            was passed under direct vision. Throughout the                            procedure, the patient's blood pressure, pulse, and                            oxygen saturations were monitored continuously. The                            Colonoscope was introduced through the anus and                            advanced to the the cecum,  identified by                            appendiceal orifice and ileocecal valve. The                            colonoscopy was performed without difficulty. The                            patient tolerated the procedure well. The quality                            of the bowel preparation was excellent. The                            ileocecal valve, appendiceal orifice, and rectum                            were photographed. The quality of the bowel                            preparation was evaluated using the BBPS [facility]                            Bowel Preparation Scale) with scores of: Right                            Colon = 3, Transverse Colon = 3 and Left Colon = 3                            (entire mucosa seen well with no residual staining,                            small fragments of stool or opaque liquid). The                            total BBPS score equals 9. The bowel preparation  used was SUPREP. Scope In: 8:29:43 AM Scope Out: 8:46:35 AM Scope Withdrawal Time: 0 hours 14 minutes 20 seconds  Total Procedure Duration: 0 hours 16 minutes 52 seconds  Findings:                 The perianal and digital rectal examinations were                            normal.                           A 4 mm polyp was found in the proximal ascending                            colon. The polyp was sessile. The polyp was removed                            with a cold snare. Resection and retrieval were                            complete.                           A 12 mm polyp was found in the mid transverse                            colon. The polyp was sessile. The polyp was removed                            with a hot snare. Resection and retrieval were                            complete.                           Multiple medium-mouthed diverticula were found in                            the left colon and right colon.                           The  exam was otherwise without abnormality on                            direct and retroflexion views. Complications:            No immediate complications. Estimated Blood Loss:     Estimated blood loss: none. Impression:               - One 4 mm polyp in the proximal ascending colon,                            removed with a cold snare. Resected and retrieved.                           - One 12 mm polyp  in the mid transverse colon,                            removed with a hot snare. Resected and retrieved.                           - Diverticulosis in the left colon and in the right                            colon.                           - The examination was otherwise normal on direct                            and retroflexion views. Recommendation:           - Patient has a contact number available for                            emergencies. The signs and symptoms of potential                            delayed complications were discussed with the                            patient. Return to normal activities tomorrow.                            Written discharge instructions were provided to the                            patient.                           - Resume previous diet.                           - Continue present medications.                           - No aspirin, ibuprofen, naproxen, or other                            non-steroidal anti-inflammatory drugs for 5 days                            after polyp removal.                           - Await pathology results.                           - Repeat colonoscopy is recommended for                            surveillance. The colonoscopy date will be  determined after pathology results from today's                            exam become available for review. Corbin Hott L. Loletha Carrow, MD 10/26/2016 8:50:11 AM This report has been signed electronically.

## 2016-10-26 NOTE — Progress Notes (Signed)
A/ox3, pleased with MAC, report to RN 

## 2016-10-27 ENCOUNTER — Telehealth: Payer: Self-pay | Admitting: *Deleted

## 2016-10-27 NOTE — Telephone Encounter (Signed)
  Follow up Call-  Call back number 10/26/2016  Post procedure Call Back phone  # [phone] cell  Permission to leave phone message No  Some recent data might be hidden     Patient questions:  Do you have a fever, pain , or abdominal swelling? No. Pain Score  0 *  Have you tolerated food without any problems? yes  Have you been able to return to your normal activities? Yes   Do you have any questions about your discharge instructions: Diet   No Medications  No Follow up visit  No   Do you have questions or concerns about your Care? No.  Actions: * If pain score is 4 or above: No action needed, pain <4.

## 2016-10-29 ENCOUNTER — Encounter: Payer: Self-pay | Admitting: Gastroenterology

## 2016-12-31 NOTE — Progress Notes (Deleted)
Christopher Steele is a 56 y.o. male is here to discuss:  SCRIBE STATEMENT  History of Present Illness:   No chief complaint on file.   HPI  There are no preventive care reminders to display for this patient.  Past Medical History:  Diagnosis Date  . Asthma   . GERD (gastroesophageal reflux disease)   . Hypertension   . Vitamin B 12 deficiency      Social History   Social History  . Marital status: Married    Spouse name: N/A  . Number of children: N/A  . Years of education: N/A   Occupational History  . Not on file.   Social History Main Topics  . Smoking status: Never Smoker  . Smokeless tobacco: Never Used  . Alcohol use Yes     Comment: rarely  . Drug use: No  . Sexual activity: No   Other Topics Concern  . Not on file   Social History Narrative   Works 3rd shift, in maintenance   Married    Librarian, academic for the Residency    Children -- 2 children,  2 and 53 -- both live with them, [name]    Past Surgical History:  Procedure Laterality Date  . 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 THROAT SURGERY     polyp    Family History  Problem Relation Age of Onset  . Asthma Mother   . Hypertension Mother   . Diabetes Mother   . Heart disease Father   . Heart disease Sister   . Diabetes Sister   . High Cholesterol Sister   . Hypertension Sister   . Colon cancer Neg Hx   . Esophageal cancer Neg Hx   . Rectal cancer Neg Hx   . Stomach cancer Neg Hx   . Prostate cancer Neg Hx     PMHx, SurgHx, SocialHx, FamHx, Medications, and Allergies were reviewed in the Visit Navigator and updated as appropriate.   Patient Active Problem List   Diagnosis Date Noted  . OSA (obstructive sleep apnea) 09/09/2016  . Morbid (severe) obesity due to excess calories ([place]) 09/09/2016  . Essential hypertension 09/02/2016  . GERD (gastroesophageal reflux disease) 09/02/2016  . Low hemoglobin 09/02/2016  . Vitamin B12 deficiency 09/02/2016  . Mild persistent asthma  without complication 41/66/0630  . Chronic cough 05/21/2010    Social History  Substance Use Topics  . Smoking status: Never Smoker  . Smokeless tobacco: Never Used  . Alcohol use Yes     Comment: rarely    Current Medications and Allergies:    Current Outpatient Prescriptions:  .  albuterol (PROAIR HFA) 108 (90 Base) MCG/ACT inhaler, Inhale 2 puffs into the lungs every 6 (six) hours as needed for wheezing or shortness of breath., Disp: , Rfl:  .  amLODipine (NORVASC) 5 MG tablet, Take 1 tablet (5 mg total) by mouth daily., Disp: 90 tablet, Rfl: 3 .  budesonide-formoterol (SYMBICORT) 160-4.5 MCG/ACT inhaler, Inhale 2 puffs into the lungs 2 (two) times daily., Disp: 1 Inhaler, Rfl: 11 .  cyanocobalamin (,VITAMIN B-12,) 1000 MCG/ML injection, Inject 1 mL (1,000 mcg total) into the muscle every 30 (thirty) days., Disp: 10 mL, Rfl: 0 .  ranitidine (ZANTAC) 300 MG tablet, Take 300 mg by mouth daily. , Disp: , Rfl:  .  vitamin B-12 (CYANOCOBALAMIN) 1000 MCG tablet, Take 1,000 mcg by mouth daily., Disp: , Rfl:   Current Facility-Administered Medications:  .  0.9 %  sodium chloride infusion,  500 mL, Intravenous, Continuous, Danis, Estill Cotta III, MD  No Known Allergies  Review of Systems   ROS  Vitals:  There were no vitals filed for this visit.   There is no height or weight on file to calculate BMI.   Physical Exam:    Physical Exam   Assessment and Plan:    There are no diagnoses linked to this encounter.  . Reviewed expectations re: course of current medical issues. . Discussed self-management of symptoms. . Outlined signs and symptoms indicating need for more acute intervention. . Patient verbalized understanding and all questions were answered. . See orders for this visit as documented in the electronic medical record. . Patient received an After Visit Summary.  CMA or LPN served as scribe during this visit. History, Physical, and Plan performed by medical provider.  Documentation and orders reviewed and attested to.  [name], PA-C [place], [place] 12/31/2016  Follow-up: No Follow-up on file.

## 2017-01-01 ENCOUNTER — Encounter: Payer: Self-pay | Admitting: Physician Assistant

## 2017-01-01 ENCOUNTER — Ambulatory Visit (INDEPENDENT_AMBULATORY_CARE_PROVIDER_SITE_OTHER): Payer: BLUE CROSS/BLUE SHIELD | Admitting: Physician Assistant

## 2017-01-01 VITALS — BP 122/80 | HR 64 | Ht 68.0 in | Wt 272.4 lb

## 2017-01-01 DIAGNOSIS — I10 Essential (primary) hypertension: Secondary | ICD-10-CM | POA: Diagnosis not present

## 2017-01-01 DIAGNOSIS — E538 Deficiency of other specified B group vitamins: Secondary | ICD-10-CM

## 2017-01-01 LAB — VITAMIN B12: Vitamin B-12: >1500 pg/mL — ABNORMAL HIGH (ref 211–911)

## 2017-01-01 NOTE — Patient Instructions (Addendum)
It was great to see you!  Continue the blood pressure medication.  We will call you with your lab results.  CONGRATS ON THE ONGOING WEIGHT LOSS!!! KEEP IT UP!

## 2017-01-01 NOTE — Progress Notes (Signed)
Christopher Steele is a 56 y.o. male is here to discuss:  History of Present Illness:   Chief Complaint  Patient presents with  . Follow-up  . Hypertension  . Vitamin B12 Deficiency   HTN Christopher Steele reports stable home blood pressure readings at home. He brought in a copy of them today. Denies SOB, chest pain, palpatations, dizziness or weakness. He is taking Amlodipine 5mg  daily without any reports s/e. Blood pressure log reveals BP mostly averaging 115/75.   Vitamin B12 Deficiency Patient has been taking vitamin B12 1000 mcg daily. He is doing monthly injections at home. He is due for a repeat Vitamin B12 today. Denies any side effects from medications or unusual neurological symptoms such as numbness/tingling.  There are no preventive care reminders to display for this patient.  Past Medical History:  Diagnosis Date  . Asthma   . GERD (gastroesophageal reflux disease)   . Vitamin B 12 deficiency      Social History   Social History  . Marital status: Married    Spouse name: N/A  . Number of children: N/A  . Years of education: N/A   Occupational History  . Not on file.   Social History Main Topics  . Smoking status: Never Smoker  . Smokeless tobacco: Never Used  . Alcohol use Yes     Comment: rarely  . Drug use: No  . Sexual activity: No   Other Topics Concern  . Not on file   Social History Narrative   Works 3rd shift, in maintenance   Married    Librarian, academic for the [employer]    Children -- 2 children,  89 and 45 -- both live with them, [name]    Past Surgical History:  Procedure Laterality Date  . 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 THROAT SURGERY     polyp    Family History  Problem Relation Age of Onset  . Asthma Mother   . Hypertension Mother   . Diabetes Mother   . Heart disease Father   . Heart disease Sister   . Diabetes Sister   . High Cholesterol Sister   . Hypertension Sister   . Colon cancer Neg Hx   . Esophageal cancer Neg Hx   .  Rectal cancer Neg Hx   . Stomach cancer Neg Hx   . Prostate cancer Neg Hx     PMHx, SurgHx, SocialHx, FamHx, Medications, and Allergies were reviewed in the Visit Navigator and updated as appropriate.   Patient Active Problem List   Diagnosis Date Noted  . OSA (obstructive sleep apnea) 09/09/2016  . Morbid (severe) obesity due to excess calories ([place]) 09/09/2016  . Essential hypertension 09/02/2016  . GERD (gastroesophageal reflux disease) 09/02/2016  . Low hemoglobin 09/02/2016  . Vitamin B12 deficiency 09/02/2016  . Mild persistent asthma without complication 16/03/9603  . Chronic cough 05/21/2010    Social History  Substance Use Topics  . Smoking status: Never Smoker  . Smokeless tobacco: Never Used  . Alcohol use Yes     Comment: rarely    Current Medications and Allergies:    Current Outpatient Prescriptions:  .  amLODipine (NORVASC) 5 MG tablet, Take 1 tablet (5 mg total) by mouth daily., Disp: 90 tablet, Rfl: 3 .  cyanocobalamin (,VITAMIN B-12,) 1000 MCG/ML injection, Inject 1 mL (1,000 mcg total) into the muscle every 30 (thirty) days., Disp: 10 mL, Rfl: 0 .  ranitidine (ZANTAC) 300 MG tablet, Take 300 mg by mouth  daily. , Disp: , Rfl:  .  vitamin B-12 (CYANOCOBALAMIN) 1000 MCG tablet, Take 1,000 mcg by mouth daily., Disp: , Rfl:  .  albuterol (PROAIR HFA) 108 (90 Base) MCG/ACT inhaler, Inhale 2 puffs into the lungs every 6 (six) hours as needed for wheezing or shortness of breath., Disp: , Rfl:  .  budesonide-formoterol (SYMBICORT) 160-4.5 MCG/ACT inhaler, Inhale 2 puffs into the lungs 2 (two) times daily. (Patient not taking: Reported on 01/01/2017), Disp: 1 Inhaler, Rfl: 11  Current Facility-Administered Medications:  .  0.9 %  sodium chloride infusion, 500 mL, Intravenous, Continuous, Danis, Estill Cotta III, MD  No Known Allergies  Review of Systems   Review of Systems  Constitutional: Negative for chills, fever, malaise/fatigue and weight loss.  Eyes: Negative  for blurred vision.  Respiratory: Negative for cough and shortness of breath.   Cardiovascular: Negative for chest pain, palpitations and leg swelling.  Gastrointestinal: Negative for nausea and vomiting.     Vitals:   Vitals:   01/01/17 0819  BP: 122/80  Pulse: 64  SpO2: 98%  Weight: 272 lb 6.4 oz (123.6 kg)  Height: 5\' 8"  (1.727 m)     Body mass index is 41.42 kg/m.   Physical Exam:    Physical Exam  Constitutional: He appears well-developed. He is cooperative.  Non-toxic appearance. He does not have a sickly appearance. He does not appear ill. No distress.  Cardiovascular: Normal rate, regular rhythm, S1 normal, S2 normal, normal heart sounds and normal pulses.   No LE edema  Pulmonary/Chest: Effort normal and breath sounds normal.  Neurological: He is alert.  Psychiatric: He has a normal mood and affect. His speech is normal and behavior is normal. Thought content normal.  Nursing note and vitals reviewed.    Assessment and Plan:    Christopher Steele was seen today for follow-up, hypertension and vitamin b12 deficiency.  Diagnoses and all orders for this visit:  Essential hypertension Blood pressure is now well controlled. Continue Norvasc 5 mg. Continue to work on weight loss. Continue to monitor blood pressures at home, if any abnormal numbers, give [place] a call. We will see him in 6 months.  B12 deficiency Repeat Vitamin B12 today. Will advise on further supplementation after labs result. -     Vitamin B12   . Reviewed expectations re: course of current medical issues. . Discussed self-management of symptoms. . Outlined signs and symptoms indicating need for more acute intervention. . Patient verbalized understanding and all questions were answered. . See orders for this visit as documented in the electronic medical record. . Patient received an After Visit Summary.  CMA or LPN served as scribe during this visit. History, Physical, and Plan performed by medical provider.  Documentation and orders reviewed and attested to.  [name], PA-C [place], [place] 01/01/2017  Follow-up: Return in about 6 months (around 07/04/2017) for HTN.

## 2017-01-07 ENCOUNTER — Telehealth: Payer: Self-pay | Admitting: Physician Assistant

## 2017-01-07 NOTE — Telephone Encounter (Signed)
Left message on voicemail to call office.   Please call patient and let him know that his vitamin B12 is now above normal. He can stop all supplements and injections at this time and we will re-check at 6 months.

## 2017-01-07 NOTE — Telephone Encounter (Signed)
Patient returned call again to talk with a nurse about test results. 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

## 2017-01-07 NOTE — Telephone Encounter (Signed)
Patient returning phone call about test results. Please call and advise. OK to leave detailed message if no answer.   -hs

## 2017-01-07 NOTE — Telephone Encounter (Signed)
Left voicemail for patient to call office back

## 2017-01-08 NOTE — Telephone Encounter (Signed)
Left detailed message on personal voicemail, told him vitamin B12 is now above normal. He can stop all supplements and injections at this time and we will re-check at 6 months per [facility]. Any questions please call office at [phone].

## 2017-01-11 ENCOUNTER — Ambulatory Visit: Payer: BLUE CROSS/BLUE SHIELD | Admitting: Internal Medicine

## 2017-07-30 ENCOUNTER — Encounter: Payer: Self-pay | Admitting: Family Medicine

## 2017-07-30 ENCOUNTER — Ambulatory Visit (INDEPENDENT_AMBULATORY_CARE_PROVIDER_SITE_OTHER): Payer: BLUE CROSS/BLUE SHIELD | Admitting: Family Medicine

## 2017-07-30 VITALS — BP 126/76 | HR 89 | Temp 100.1°F | Ht 68.0 in | Wt 288.8 lb

## 2017-07-30 DIAGNOSIS — J4541 Moderate persistent asthma with (acute) exacerbation: Secondary | ICD-10-CM

## 2017-07-30 DIAGNOSIS — R509 Fever, unspecified: Secondary | ICD-10-CM

## 2017-07-30 DIAGNOSIS — J0100 Acute maxillary sinusitis, unspecified: Secondary | ICD-10-CM

## 2017-07-30 LAB — POCT INFLUENZA A/B
Influenza A, POC: NEGATIVE
Influenza B, POC: NEGATIVE

## 2017-07-30 MED ORDER — AZITHROMYCIN 250 MG PO TABS: ORAL_TABLET | ORAL | 0 refills | Status: DC

## 2017-07-30 MED ORDER — ALBUTEROL SULFATE HFA 108 (90 BASE) MCG/ACT IN AERS: 2.0000 | INHALATION_SPRAY | Freq: Four times a day (QID) | RESPIRATORY_TRACT | 2 refills | Status: DC | PRN

## 2017-07-30 MED ORDER — PREDNISONE 20 MG PO TABS
ORAL_TABLET | ORAL | 0 refills | Status: DC
Start: 2017-07-30 — End: 2018-07-26

## 2017-07-30 NOTE — Patient Instructions (Signed)
Sinsusitis but also appears asthma is flared up Sinusitis is Viral based on <10 days, no double sickening, lack of severity of symptoms in first 3 days. Educated on signs that bacterial infection may have developed (symptoms over 10 days, double sickening).   Treatment: -considered steroid: we opted in- will trial prednisone for both the sinuses and to calm down ashtma.  -other symptomatic care with albuterol as needed (advised every 6 hours for next 24 hours) -Antibiotic indicated: no but if symptoms are not improving by Monday he can fill the azithromycin antibiotic prescription  Finally, we reviewed reasons to return to care including if symptoms worsen or persist or new concerns arise (particularly fever above 100.5 or shortness of breath)  Meds ordered this encounter  Medications  . predniSONE (DELTASONE) 20 MG tablet    Sig: Take 2 pills for 3 days, 1 pill for 4 days    Dispense:  10 tablet    Refill:  0  . azithromycin (ZITHROMAX) 250 MG tablet    Sig: Take 2 tabs on day 1, then 1 tab daily until finished    Dispense:  6 tablet    Refill:  0

## 2017-07-30 NOTE — Progress Notes (Signed)
PCP: [name], PA  Subjective:  Christopher Steele is a 57 y.o. year old very pleasant male patient who presents with sinusitis symptoms including nasal congestion, sinus tenderness, runny nose. Slight wheeze and shortness of breath- denies having albuterol on hand. He denies fever though temperature slightly elevated at 100.1 today -other symptoms include: mild fatigue -day of illness: 8 -Symptoms are improving but only slightly -previous treatments: multiple OTC medicines. Has started his symbicort back but only intermittent reports he had stopped this unfortunately -sick contacts/travel/risks: grandson Tested flu positive- 10 days ago   ROS-denies fever, SOB, NVD, tooth pain  Pertinent Past Medical History-  Patient Active Problem List   Diagnosis Date Noted  . OSA (obstructive sleep apnea) 09/09/2016  . Morbid (severe) obesity due to excess calories ([place]) 09/09/2016  . Essential hypertension 09/02/2016  . GERD (gastroesophageal reflux disease) 09/02/2016  . Low hemoglobin 09/02/2016  . Vitamin B12 deficiency 09/02/2016  . Mild persistent asthma without complication 47/82/9562  . Chronic cough 05/21/2010    Medications- reviewed  Current Outpatient Medications  Medication Sig Dispense Refill  . albuterol (PROAIR HFA) 108 (90 Base) MCG/ACT inhaler Inhale 2 puffs into the lungs every 6 (six) hours as needed for wheezing or shortness of breath.    Marland Kitchen amLODipine (NORVASC) 5 MG tablet Take 1 tablet (5 mg total) by mouth daily. 90 tablet 3  . budesonide-formoterol (SYMBICORT) 160-4.5 MCG/ACT inhaler Inhale 2 puffs into the lungs 2 (two) times daily. 1 Inhaler 11  . cyanocobalamin (,VITAMIN B-12,) 1000 MCG/ML injection Inject 1 mL (1,000 mcg total) into the muscle every 30 (thirty) days. 10 mL 0  . ranitidine (ZANTAC) 300 MG tablet Take 300 mg by mouth daily.     . vitamin B-12 (CYANOCOBALAMIN) 1000 MCG tablet Take 1,000 mcg by mouth daily.     No current facility-administered  medications for this visit.     Objective: BP 126/76 (BP Location: Left Arm, Patient Position: Sitting, Cuff Size: Large)   Pulse 89   Temp 100.1 F (37.8 C) (Oral)   Ht 5\' 8"  (1.727 m)   Wt 288 lb 12.8 oz (131 kg)   SpO2 95%   BMI 43.91 kg/m  Gen: NAD, resting comfortably HEENT: Turbinates erythematous with clear/yellow drainage, TM normal, pharynx mildly erythematous with no tonsilar exudate or edema, minimal sinus tenderness CV: RRR no murmurs rubs or gallops Lungs: CTAB no crackles, wheeze, rhonchi Ext: no edema Skin: warm, dry, no rash  Results for orders placed or performed in visit on 07/30/17 (from the past 24 hour(s))  POCT Influenza A/B     Status: None   Collection Time: 07/30/17  3:29 PM  Result Value Ref Range   Influenza A, POC Negative Negative   Influenza B, POC Negative Negative   Assessment/Plan:  Sinsusitis but also appears asthma is flared up Sinusitis is Viral based on <10 days, no double sickening, lack of severity of symptoms in first 3 days. Educated on signs that bacterial infection may have developed (symptoms over 10 days, double sickening).   We did a flu test with elevated temperature at 100.1 and flu exposure (flu test negative and 8 days in will hold off on tamiflu)- with that being said his symptoms seem to be more sinus related. Also mild flare of asthma. Because of the fever- I felt more strongly about him having azithromycin on hand in case he fails to improve. I did tell him albuterol should be his prn not his symbicort- advised regularly taking symbicort  with asthma history  Treatment: -considered steroid: we opted in- will trial prednisone for both the sinuses and to calm down ashtma.  -other symptomatic care with albuterol as needed (advised every 6 hours for next 24 hours) -Antibiotic indicated: no but if symptoms are not improving by Monday he can fill the azithromycin antibiotic prescription  Finally, we reviewed reasons to return to  care including if symptoms worsen or persist or new concerns arise (particularly fever above 100.5 or shortness of breath)  Meds ordered this encounter  Medications  . predniSONE (DELTASONE) 20 MG tablet    Sig: Take 2 pills for 3 days, 1 pill for 4 days    Dispense:  10 tablet    Refill:  0  . azithromycin (ZITHROMAX) 250 MG tablet    Sig: Take 2 tabs on day 1, then 1 tab daily until finished    Dispense:  6 tablet    Refill:  0    Garret Reddish, MD

## 2017-08-03 ENCOUNTER — Telehealth: Payer: Self-pay | Admitting: Physician Assistant

## 2017-08-03 NOTE — Telephone Encounter (Signed)
Contacted pt regarding azithromycin; pt given instructions per Dr Yong Channel dated 07/30/17, "Antibiotic indicated: no but if symptoms are not improving by Monday he can fill the azithromycin antibiotic prescription"; pt also instructed on taking medication;       . azithromycin (ZITHROMAX) 250 MG tablet    Sig: Take 2 tabs on day 1, then 1 tab daily until finished    Dispense:  6 tablet    Refill:  0   Pt verbalizes understanding; will route to [place] for notification of this encounter.

## 2017-08-03 NOTE — Telephone Encounter (Signed)
Copied from [place]. Topic: General - Other >> Aug 03, 2017 11:27 AM Conception Chancy, NT wrote: Patient is requesting a call back from a nurse. Would like to know since the prednisone is not working can he go ahead and start taking azithromycin that was prescribed.

## 2017-08-03 NOTE — Telephone Encounter (Signed)
noted 

## 2017-08-03 NOTE — Telephone Encounter (Signed)
See note

## 2017-10-04 ENCOUNTER — Telehealth: Payer: Self-pay | Admitting: Radiology

## 2017-10-04 NOTE — Telephone Encounter (Signed)
Telephone Advise Record:   Tick Bite: Caller found a tick on him and pulled it out and has some questions. Caller states he removed tick.

## 2017-10-05 NOTE — Telephone Encounter (Signed)
Per patient he is fine now. No appt needed.

## 2018-02-03 ENCOUNTER — Telehealth: Payer: Self-pay | Admitting: Physician Assistant

## 2018-02-03 ENCOUNTER — Other Ambulatory Visit: Payer: Self-pay | Admitting: Internal Medicine

## 2018-02-03 MED ORDER — ALBUTEROL SULFATE HFA 108 (90 BASE) MCG/ACT IN AERS: 2.0000 | INHALATION_SPRAY | Freq: Four times a day (QID) | RESPIRATORY_TRACT | 1 refills | Status: DC | PRN

## 2018-02-03 NOTE — Telephone Encounter (Signed)
Copied from [place] [id number]. Topic: Quick Communication - See Telephone Encounter >> Feb 03, 2018  3:36 PM Mylinda Latina, NT wrote: CRM for notification. See Telephone encounter for: 02/03/18. Patient called and states he needs a refill of his albuterol [facility]) [address]) MCG/ACT inhaler  [address] [place], [place] [phone] (Phone) [phone] (Fax)

## 2018-03-16 ENCOUNTER — Telehealth: Payer: Self-pay | Admitting: Radiology

## 2018-03-16 NOTE — Telephone Encounter (Signed)
Copied from [place] [id number]. Topic: General - Other >> Mar 16, 2018  4:25 PM Keene Breath wrote: Reason for CRM: Patient called to speak with the nurse to find out if he is up to date with his tetanus vaccine.  Patient will be starting a new job and needs all his shots updated.  Please advise.  CB# [phone]

## 2018-03-16 NOTE — Telephone Encounter (Signed)
Copied from [place] [id number]. Topic: General - Other >> Mar 16, 2018  4:25 PM Keene Breath wrote: Reason for CRM: Patient called to speak with the nurse to find out if he is up to date with his tetanus vaccine.  Patient will be starting a new job and needs all his shots updated.  Please advise.  CB# [phone]

## 2018-03-17 NOTE — Telephone Encounter (Signed)
Patient called back this morning to leave the # to fax info from previous message to  [facility]  [phone]

## 2018-03-17 NOTE — Telephone Encounter (Signed)
Left detailed message on personal voicemail. I faxed last tetanus shot information over to [employer] to the number you gave [place]. Tetanus was given 09/02/2016. Any questions please call office or if you need anything else.

## 2018-03-17 NOTE — Telephone Encounter (Signed)
See note

## 2018-05-16 ENCOUNTER — Emergency Department (HOSPITAL_COMMUNITY): Payer: BLUE CROSS/BLUE SHIELD

## 2018-05-16 ENCOUNTER — Encounter (HOSPITAL_COMMUNITY): Payer: Self-pay | Admitting: Emergency Medicine

## 2018-05-16 ENCOUNTER — Emergency Department (HOSPITAL_COMMUNITY)
Admission: EM | Admit: 2018-05-16 | Discharge: 2018-05-16 | Disposition: A | Discharge status: Home / Self Care | Payer: BLUE CROSS/BLUE SHIELD | Attending: Emergency Medicine | Admitting: Emergency Medicine

## 2018-05-16 DIAGNOSIS — R1013 Epigastric pain: Secondary | ICD-10-CM | POA: Diagnosis present

## 2018-05-16 DIAGNOSIS — J45909 Unspecified asthma, uncomplicated: Secondary | ICD-10-CM | POA: Diagnosis not present

## 2018-05-16 DIAGNOSIS — Z79899 Other long term (current) drug therapy: Secondary | ICD-10-CM | POA: Insufficient documentation

## 2018-05-16 DIAGNOSIS — I10 Essential (primary) hypertension: Secondary | ICD-10-CM | POA: Insufficient documentation

## 2018-05-16 LAB — COMPREHENSIVE METABOLIC PANEL
ALT: 56 U/L — ABNORMAL HIGH (ref 0–44)
ANION GAP: 6 (ref 5–15)
AST: 132 U/L — AB (ref 15–41)
Albumin: 3.7 g/dL (ref 3.5–5.0)
Alkaline Phosphatase: 56 U/L (ref 38–126)
BUN: 17 mg/dL (ref 6–20)
CO2: 27 mmol/L (ref 22–32)
Calcium: 8.5 mg/dL — ABNORMAL LOW (ref 8.9–10.3)
Chloride: 107 mmol/L (ref 98–111)
Creatinine, Ser: 1.19 mg/dL (ref 0.61–1.24)
GFR calc Af Amer: >60 mL/min (ref >60)
GFR calc non Af Amer: >60 mL/min (ref >60)
GLUCOSE: 114 mg/dL — AB (ref 70–99)
POTASSIUM: 3.8 mmol/L (ref 3.5–5.1)
Sodium: 140 mmol/L (ref 135–145)
TOTAL PROTEIN: 6.9 g/dL (ref 6.5–8.1)
Total Bilirubin: 0.8 mg/dL (ref 0.3–1.2)

## 2018-05-16 LAB — CBC WITH DIFFERENTIAL/PLATELET
Abs Immature Granulocytes: 0.01 10*3/uL (ref 0.00–0.07)
BASOS PCT: 1 %
Basophils Absolute: 0 10*3/uL (ref 0.0–0.1)
EOS ABS: 0.1 10*3/uL (ref 0.0–0.5)
Eosinophils Relative: 4 %
HCT: 41.3 % (ref 39.0–52.0)
Hemoglobin: 12.6 g/dL — ABNORMAL LOW (ref 13.0–17.0)
IMMATURE GRANULOCYTES: 0 %
Lymphocytes Relative: 38 %
Lymphs Abs: 1.3 10*3/uL (ref 0.7–4.0)
MCH: 30.4 pg (ref 26.0–34.0)
MCHC: 30.5 g/dL (ref 30.0–36.0)
MCV: 99.8 fL (ref 80.0–100.0)
MONOS PCT: 9 %
Monocytes Absolute: 0.3 10*3/uL (ref 0.1–1.0)
NEUTROS PCT: 48 %
Neutro Abs: 1.6 10*3/uL — ABNORMAL LOW (ref 1.7–7.7)
PLATELETS: 250 10*3/uL (ref 150–400)
RBC: 4.14 MIL/uL — ABNORMAL LOW (ref 4.22–5.81)
RDW: 11.9 % (ref 11.5–15.5)
WBC: 3.3 10*3/uL — AB (ref 4.0–10.5)
nRBC: 0 % (ref 0.0–0.2)

## 2018-05-16 LAB — I-STAT TROPONIN, ED: Troponin i, poc: 0 ng/mL (ref 0.00–0.08)

## 2018-05-16 LAB — LIPASE, BLOOD: Lipase: 28 U/L (ref 11–51)

## 2018-05-16 MED ORDER — ONDANSETRON HCL 4 MG/2ML IJ SOLN
4.0000 mg | Freq: Once | INTRAMUSCULAR | Status: AC
  Administered 2018-05-16: 4 mg via INTRAVENOUS
  Filled 2018-05-16: qty 2

## 2018-05-16 MED ORDER — FENTANYL CITRATE (PF) 100 MCG/2ML IJ SOLN
100.0000 ug | Freq: Once | INTRAMUSCULAR | Status: AC
  Administered 2018-05-16: 100 ug via INTRAVENOUS
  Filled 2018-05-16: qty 2

## 2018-05-16 NOTE — ED Notes (Signed)
ED Provider at bedside. 

## 2018-05-16 NOTE — ED Provider Notes (Signed)
[facility] EMERGENCY DEPARTMENT Provider Note   CSN: [id number] Arrival date & time: 05/16/18  0358     History   Chief Complaint Chief Complaint  Patient presents with  . Chest Pain    HPI Christopher Steele is a 57 y.o. male.  The history is provided by the patient and the spouse.  Abdominal Pain   This is a new problem. The current episode started 1 to 2 hours ago. The problem occurs constantly. The problem has been rapidly worsening. The pain is located in the epigastric region. The quality of the pain is sharp. The pain is severe. Pertinent negatives include fever and vomiting. The symptoms are aggravated by palpation. Nothing relieves the symptoms.  Patient with history of GERD and asthma presents with upper abdominal pain.  He reports over the past hours been having sharp upper abdominal pain.  It does not radiate.  He reports mild shortness of breath at times.  No vomiting.  He has not had this pain previously  Past Medical History:  Diagnosis Date  . Asthma   . GERD (gastroesophageal reflux disease)   . Vitamin B 12 deficiency     Patient Active Problem List   Diagnosis Date Noted  . OSA (obstructive sleep apnea) 09/09/2016  . Morbid (severe) obesity due to excess calories ([place]) 09/09/2016  . Essential hypertension 09/02/2016  . GERD (gastroesophageal reflux disease) 09/02/2016  . Low hemoglobin 09/02/2016  . Vitamin B12 deficiency 09/02/2016  . Mild persistent asthma without complication 44/06/270  . Chronic cough 05/21/2010    Past Surgical History:  Procedure Laterality Date  . 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 THROAT SURGERY     polyp        Home Medications    Prior to Admission medications   Medication Sig Start Date End Date Taking? Authorizing Provider  albuterol (PROAIR HFA) 108 (90 Base) MCG/ACT inhaler Inhale 2 puffs into the lungs every 6 (six) hours as needed for wheezing or shortness of breath. 02/03/18   [name], PA  amLODipine (NORVASC) 5 MG tablet Take 1 tablet (5 mg total) by mouth daily. 09/02/16   [name], PA  azithromycin (ZITHROMAX) 250 MG tablet Take 2 tabs on day 1, then 1 tab daily until finished 07/30/17   Marin Olp, MD  cyanocobalamin (,VITAMIN B-12,) 1000 MCG/ML injection Inject 1 mL (1,000 mcg total) into the muscle every 30 (thirty) days. 10/02/16   [name], PA  predniSONE (DELTASONE) 20 MG tablet Take 2 pills for 3 days, 1 pill for 4 days 07/30/17   Marin Olp, MD  ranitidine (ZANTAC) 300 MG tablet Take 300 mg by mouth daily.     [provider]  SYMBICORT 160-4.5 MCG/ACT inhaler INHALE 2 PUFFS BY MOUTH TWICE DAILY 02/03/18   Tanda Rockers, MD  vitamin B-12 (CYANOCOBALAMIN) 1000 MCG tablet Take 1,000 mcg by mouth daily.    [provider]    Family History Family History  Problem Relation Age of Onset  . Asthma Mother   . Hypertension Mother   . Diabetes Mother   . Heart disease Father   . Heart disease Sister   . Diabetes Sister   . High Cholesterol Sister   . Hypertension Sister   . Colon cancer Neg Hx   . Esophageal cancer Neg Hx   . Rectal cancer Neg Hx   . Stomach cancer Neg Hx   . Prostate cancer Neg Hx  Social History Social History   Tobacco Use  . Smoking status: Never Smoker  . Smokeless tobacco: Never Used  Substance Use Topics  . Alcohol use: Yes    Comment: rarely  . Drug use: No     Allergies   Patient has no known allergies.   Review of Systems Review of Systems  Constitutional: Negative for fever.  Respiratory: Positive for shortness of breath.   Gastrointestinal: Positive for abdominal pain. Negative for vomiting.  All other systems reviewed and are negative.    Physical Exam Updated Vital Signs Ht 1.803 m (5\' 11" )   Wt 131 kg   BMI 40.28 kg/m   Physical Exam CONSTITUTIONAL: Well developed/well nourished, uncomfortable appearing HEAD: Normocephalic/atraumatic EYES:  EOMI/PERRL ENMT: Mucous membranes moist NECK: supple no meningeal signs SPINE/BACK:entire spine nontender CV: S1/S2 noted, no murmurs/rubs/gallops noted LUNGS: Lungs are clear to auscultation bilaterally, no apparent distress ABDOMEN: soft, moderate epigastric tenderness, no rebound or guarding, bowel sounds noted throughout abdomen NEURO: Pt is awake/alert/appropriate, moves all extremitiesx4.  No facial droop.   EXTREMITIES: pulses normal/equal, full ROM SKIN: warm, color normal PSYCH: no abnormalities of mood noted, alert and oriented to situation   ED Treatments / Results  Labs (all labs ordered are listed, but only abnormal results are displayed) Labs Reviewed  CBC WITH DIFFERENTIAL/PLATELET - Abnormal; Notable for the following components:      Result Value   WBC 3.3 (*)    RBC 4.14 (*)    Hemoglobin 12.6 (*)    Neutro Abs 1.6 (*)    All other components within normal limits  COMPREHENSIVE METABOLIC PANEL - Abnormal; Notable for the following components:   Glucose, Bld 114 (*)    Calcium 8.5 (*)    AST 132 (*)    ALT 56 (*)    All other components within normal limits  LIPASE, BLOOD  I-STAT TROPONIN, ED    EKG EKG Interpretation  Date/Time:  Monday May 16 2018 04:05:02 EST Ventricular Rate:  73 PR Interval:    QRS Duration: 106 QT Interval:  382 QTC Calculation: 421 R Axis:   -30 Text Interpretation:  Sinus rhythm Short PR interval Left axis deviation Low voltage, precordial leads Abnormal R-wave progression, early transition Confirmed by Ripley Fraise [phone]) on 05/16/2018 4:14:28 AM   Radiology No results found.  Procedures Procedures  Medications Ordered in ED Medications  fentaNYL (SUBLIMAZE) injection 100 mcg (100 mcg Intravenous Given 05/16/18 0446)  ondansetron (ZOFRAN) injection 4 mg (4 mg Intravenous Given 05/16/18 0446)     Initial Impression / Assessment and Plan / ED Course  I have reviewed the triage vital signs and the nursing  notes.  Pertinent labs & imaging results that were available during my care of the patient were reviewed by me and considered in my medical decision making (see chart for details).     5:48 AM BP 120/74   Pulse 76   Resp 17   Ht 1.803 m (5\' 11" )   Wt 131 kg   SpO2 94%   BMI 40.28 kg/m  Patient is improved.  He has no focal abdominal tenderness.  He suspect this is related to alcohol and greasy foods that he had while watching football.  Again he denies any chest pain or shortness of breath on my exam.  He did have mild elevation in LFTs but could be due to alcohol use.  Could also have biliary colic.  However since his pain is resolved, he would like to go  home.  Advise follow-up as an outpatient as he may need outpatient ultrasound. Discussed strict return precautions.  Patient agreeable with plan  Final Clinical Impressions(s) / ED Diagnoses   Final diagnoses:  Epigastric pain    ED Discharge Orders    None       Ripley Fraise, MD 05/16/18 [phone]

## 2018-05-16 NOTE — Discharge Instructions (Addendum)
Please avoid fatty/greasy foods.  This may be your gallbladder, therefore follow-up with your primary doctor as you may need an ultrasound  SEEK IMMEDIATE MEDICAL ATTENTION IF: The pain does not go away or becomes severe, particularly over the next 8-12 hours.  A temperature above 100.59F develops.  Repeated vomiting occurs (multiple episodes).  The pain becomes localized to portions of the abdomen. The right side could possibly be appendicitis. In an adult, the left lower portion of the abdomen could be colitis or diverticulitis.  Blood is being passed in stools or vomit (bright red or black tarry stools).  Return also if you develop chest pain, difficulty breathing, dizziness or fainting, or become confused, poorly responsive, or inconsolable.

## 2018-05-16 NOTE — ED Notes (Signed)
Patient verbalizes understanding of discharge instructions. Opportunity for questioning and answers were provided. Armband removed by staff, pt discharged from ED in wheelchair.  

## 2018-05-16 NOTE — ED Triage Notes (Signed)
Pt here with heavy central chest pain radiating to right side. Chest pain occurred 1 hour ago. Denies SOB, N/V.

## 2018-07-16 ENCOUNTER — Other Ambulatory Visit: Payer: Self-pay | Admitting: Physician Assistant

## 2018-07-22 ENCOUNTER — Other Ambulatory Visit: Payer: Self-pay | Admitting: Physician Assistant

## 2018-07-26 ENCOUNTER — Ambulatory Visit (INDEPENDENT_AMBULATORY_CARE_PROVIDER_SITE_OTHER): Payer: BLUE CROSS/BLUE SHIELD | Admitting: Physician Assistant

## 2018-07-26 ENCOUNTER — Encounter: Payer: Self-pay | Admitting: Physician Assistant

## 2018-07-26 VITALS — BP 118/74 | HR 75 | Temp 98.8°F | Ht 71.0 in | Wt 293.4 lb

## 2018-07-26 DIAGNOSIS — R05 Cough: Secondary | ICD-10-CM

## 2018-07-26 DIAGNOSIS — R059 Cough, unspecified: Secondary | ICD-10-CM

## 2018-07-26 MED ORDER — AZITHROMYCIN 250 MG PO TABS: ORAL_TABLET | ORAL | 0 refills | Status: DC

## 2018-07-26 MED ORDER — PREDNISONE 20 MG PO TABS: 40.0000 mg | ORAL_TABLET | Freq: Every day | ORAL | 0 refills | Status: DC

## 2018-07-26 NOTE — Progress Notes (Signed)
Christopher Steele is a 58 y.o. male here for a new problem.  I acted as a Education administrator for [employer], PA-C Anselmo Pickler, LPN  History of Present Illness:   Chief Complaint  Patient presents with  . Cough    Cough  This is a new problem. Episode onset: Started a week ago. The problem has been gradually worsening. The problem occurs every few hours. The cough is productive of sputum (expectorating white). Associated symptoms include shortness of breath. Pertinent negatives include no chills, ear congestion, ear pain, fever, headaches, nasal congestion, postnasal drip or sore throat. Risk factors for lung disease include occupational exposure. Treatments tried: Mucinex DM. The treatment provided no relief. His past medical history is significant for asthma. There is no history of bronchitis or pneumonia.   Wt Readings from Last 5 Encounters:  07/26/18 293 lb 6.1 oz (133.1 kg)  05/16/18 288 lb 12.8 oz (131 kg)  07/30/17 288 lb 12.8 oz (131 kg)  01/01/17 272 lb 6.4 oz (123.6 kg)  10/26/16 279 lb (126.6 kg)     Past Medical History:  Diagnosis Date  . Asthma   . GERD (gastroesophageal reflux disease)   . Vitamin B 12 deficiency      Social History   Socioeconomic History  . Marital status: Married    Spouse name: Not on file  . Number of children: Not on file  . Years of education: Not on file  . Highest education level: Not on file  Occupational History  . Not on file  Social Needs  . Financial resource strain: Not on file  . Food insecurity:    Worry: Not on file    Inability: Not on file  . Transportation needs:    Medical: Not on file    Non-medical: Not on file  Tobacco Use  . Smoking status: Never Smoker  . Smokeless tobacco: Never Used  Substance and Sexual Activity  . Alcohol use: Yes    Comment: rarely  . Drug use: No  . Sexual activity: Never    Partners: Female  Lifestyle  . Physical activity:    Days per week: Not on file    Minutes per session: Not on  file  . Stress: Not on file  Relationships  . Social connections:    Talks on phone: Not on file    Gets together: Not on file    Attends religious service: Not on file    Active member of club or organization: Not on file    Attends meetings of clubs or organizations: Not on file    Relationship status: Not on file  . Intimate partner violence:    Fear of current or ex partner: Not on file    Emotionally abused: Not on file    Physically abused: Not on file    Forced sexual activity: Not on file  Other Topics Concern  . Not on file  Social History Narrative   Works 3rd shift, in maintenance   Married    Librarian, academic for the [employer]    Children -- 2 children,  9 and 47 -- both live with them, [name]    Past Surgical History:  Procedure Laterality Date  . 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 THROAT SURGERY     polyp    Family History  Problem Relation Age of Onset  . Asthma Mother   . Hypertension Mother   . Diabetes Mother   . Heart disease Father   .  Heart disease Sister   . Diabetes Sister   . High Cholesterol Sister   . Hypertension Sister   . Colon cancer Neg Hx   . Esophageal cancer Neg Hx   . Rectal cancer Neg Hx   . Stomach cancer Neg Hx   . Prostate cancer Neg Hx     No Known Allergies  Current Medications:   Current Outpatient Medications:  .  albuterol (PROAIR HFA) 108 (90 Base) MCG/ACT inhaler, Inhale 2 puffs into the lungs every 6 (six) hours as needed for wheezing or shortness of breath., Disp: 1 Inhaler, Rfl: 1 .  amLODipine (NORVASC) 5 MG tablet, TAKE 1 TABLET BY MOUTH ONCE DAILY, Disp: 90 tablet, Rfl: 0 .  azithromycin (ZITHROMAX) 250 MG tablet, Take two tablets on day 1, then one daily x 4 days, Disp: 6 tablet, Rfl: 0 .  predniSONE (DELTASONE) 20 MG tablet, Take 2 tablets (40 mg total) by mouth daily., Disp: 10 tablet, Rfl: 0   Review of Systems:   Review of Systems  Constitutional: Negative for chills and fever.  HENT: Negative for  ear pain, postnasal drip and sore throat.   Respiratory: Positive for cough and shortness of breath.   Neurological: Negative for headaches.    Vitals:   Vitals:   07/26/18 1311  BP: 118/74  Pulse: 75  Temp: 98.8 F (37.1 C)  TempSrc: Oral  SpO2: 96%  Weight: 293 lb 6.1 oz (133.1 kg)  Height: 5\' 11"  (1.803 m)     Body mass index is 40.92 kg/m.  Physical Exam:   Physical Exam Vitals signs and nursing note reviewed.  Constitutional:      General: He is not in acute distress.    Appearance: He is well-developed. He is not ill-appearing or toxic-appearing.  HENT:     Head: Normocephalic and atraumatic.     Right Ear: Tympanic membrane, ear canal and external ear normal. Tympanic membrane is not erythematous, retracted or bulging.     Left Ear: Tympanic membrane, ear canal and external ear normal. Tympanic membrane is not erythematous, retracted or bulging.     Nose: Mucosal edema, congestion and rhinorrhea present.     Right Sinus: No maxillary sinus tenderness or frontal sinus tenderness.     Left Sinus: No maxillary sinus tenderness or frontal sinus tenderness.     Mouth/Throat:     Lips: Pink.     Mouth: Mucous membranes are moist.     Pharynx: Uvula midline. Posterior oropharyngeal erythema present.     Tonsils: Swelling: 0 on the right. 0 on the left.  Eyes:     General: Lids are normal.     Conjunctiva/sclera: Conjunctivae normal.  Neck:     Trachea: Trachea normal.  Cardiovascular:     Rate and Rhythm: Normal rate and regular rhythm.     Heart sounds: Normal heart sounds, S1 normal and S2 normal.  Pulmonary:     Effort: Pulmonary effort is normal.     Breath sounds: Normal breath sounds. No decreased breath sounds, wheezing, rhonchi or rales.  Lymphadenopathy:     Cervical: No cervical adenopathy.  Skin:    General: Skin is warm and dry.  Neurological:     Mental Status: He is alert.  Psychiatric:        Speech: Speech normal.        Behavior: Behavior  normal. Behavior is cooperative.      Assessment and Plan:   Treydon was seen today for cough.  Diagnoses and all orders for this visit:  Cough  Other orders -     predniSONE (DELTASONE) 20 MG tablet; Take 2 tablets (40 mg total) by mouth daily. -     azithromycin (ZITHROMAX) 250 MG tablet; Take two tablets on day 1, then one daily x 4 days   No red flags on exam.  Has a history of asthma. Will initiate prednisone and azithromycin per orders. Discussed taking medications as prescribed. Reviewed return precautions including worsening fever, SOB, worsening cough or other concerns. Push fluids and rest. I recommend that patient follow-up if symptoms worsen or persist despite treatment x 7-10 days, sooner if needed.  . Reviewed expectations re: course of current medical issues. . Discussed self-management of symptoms. . Outlined signs and symptoms indicating need for more acute intervention. . Patient verbalized understanding and all questions were answered. . See orders for this visit as documented in the electronic medical record. . Patient received an After-Visit Summary.  CMA or LPN served as scribe during this visit. History, Physical, and Plan performed by medical provider. The above documentation has been reviewed and is accurate and complete.  [name], PA-C

## 2018-07-26 NOTE — Patient Instructions (Signed)
It was great to see you!  Start oral prednisone and azithromycin antibiotic.  Push fluids and get plenty of rest. Please return if you are not improving as expected, or if you have high fevers (>101.5) or difficulty swallowing or worsening productive cough.  Call clinic with questions.  I hope you start feeling better soon!

## 2018-08-12 ENCOUNTER — Other Ambulatory Visit: Payer: Self-pay | Admitting: Physician Assistant

## 2018-08-17 ENCOUNTER — Ambulatory Visit: Payer: Self-pay

## 2018-08-17 NOTE — Telephone Encounter (Signed)
See note

## 2018-08-17 NOTE — Telephone Encounter (Signed)
Left message on personal voicemail calling to follow up on call this morning regarding SOB did not see you on schedule. Please call office and schedule an appt tomorrow if no openings with [name] please schedule with another provider. If symptoms worsen please go to the ED.

## 2018-08-17 NOTE — Telephone Encounter (Signed)
Pt c/o h/o shortness of breath. Pt stated after eating or drinking he begins to wheeze. Pt stated he used his pro air this morning and the wheezing he was having stopped. Pt denied SOB at time of call. Pt stated that he has a productive cough with white phlegm. Denies fever, chest pain, dizziness, runny nose. Pt advised to be seen within 4 hours or at least today. Pt stated that he has to talk to his supervisor before making an appointment. Pt stated he will call back to make appointment. Care advice given and pt verbalized understanding. Routing note to office.   Reason for Disposition . [1] MILD difficulty breathing (e.g., minimal/no SOB at rest, SOB with walking, pulse <100) AND [2] NEW-onset or WORSE than normal  Answer Assessment - Initial Assessment Questions 1. RESPIRATORY STATUS: "Describe your breathing?" (e.g., wheezing, shortness of breath, unable to speak, severe coughing)      Shortness of breath 2. ONSET: "When did this breathing problem begin?"      2 months ago 3. PATTERN "Does the difficult breathing come and go, or has it been constant since it started?"      Mainly after eating or after drinking- SOB takes breathing treatment or inhaler  4. SEVERITY: "How bad is your breathing?" (e.g., mild, moderate, severe)    - MILD: No SOB at rest, mild SOB with walking, speaks normally in sentences, can lay down, no retractions, pulse < 100.    - MODERATE: SOB at rest, SOB with minimal exertion and prefers to sit, cannot lie down flat, speaks in phrases, mild retractions, audible wheezing, pulse 100-120.    - SEVERE: Very SOB at rest, speaks in single words, struggling to breathe, sitting hunched forward, retractions, pulse > 120      Denies SOB 5. RECURRENT SYMPTOM: "Have you had difficulty breathing before?" If so, ask: "When was the last time?" and "What happened that time?"      Yes- was started on Symbicort months ago- and was taking ranitidine dx with acid reflux 6. CARDIAC HISTORY:  "Do you have any history of heart disease?" (e.g., heart attack, angina, bypass surgery, angioplasty)      HTN 7. LUNG HISTORY: "Do you have any history of lung disease?"  (e.g., pulmonary embolus, asthma, emphysema)     Asthma -  8. CAUSE: "What do you think is causing the breathing problem?"      Pt is not sure maybe what he ate 9. OTHER SYMPTOMS: "Do you have any other symptoms? (e.g., dizziness, runny nose, cough, chest pain, fever)     Cough- white phlegm, wheezing- after using inhaler wheezing has stopped. 10. PREGNANCY: "Is there any chance you are pregnant?" "When was your last menstrual period?"       n/a 11. TRAVEL: "Have you traveled out of the country in the last month?" (e.g., travel history, exposures)       no  Protocols used: BREATHING DIFFICULTY-A-AH

## 2018-10-01 ENCOUNTER — Other Ambulatory Visit: Payer: Self-pay | Admitting: Internal Medicine

## 2018-10-03 ENCOUNTER — Telehealth: Payer: Self-pay | Admitting: Physician Assistant

## 2018-10-03 NOTE — Telephone Encounter (Signed)
Left detailed message on personal voicemail, need to contact Dr. Gustavus Bryant office for refill on Inhalers. Looks like Dr. Melvyn Novas already sent your Symbicort Inhaler refill in today just call there office and let them know you need your Albuterol also. Any questions please call office.

## 2018-10-03 NOTE — Telephone Encounter (Signed)
[place] [facility] [place] Patient Name: Christopher Steele Gender: Male DOB: 1960-06-29  Age: 58 Y 69 M 7 D Return Phone Number: [phone] (Primary) Address:  City/State/Zip: [place]  [address] Client [place] [facility] [place] Engineer, building services [facility] [place] Night Physician [name]- [place] Contact Type Call Who Is Calling Patient / Member / Family / Caregiver Call Type Triage / Clinical Relationship To Patient Self Return Phone Number [phone] (Primary) Chief Complaint Prescription Refill or Medication Request (non symptomatic) Reason for Call Medication Question / Request Initial Comment Caller states he is calling for a refill for symbicort and albuteral sulfate. Pt is out and needs it. Translation No Nurse Assessment Nurse: Cherie Dark, RN, Jarrett Soho Date/Time (Eastern Time): 10/01/2018 2:20:14 PM Confirm and document reason for call. If symptomatic, describe symptoms. ---Caller states that he needs a refill on his Symbicort and his albuterol. He uses his inhaler and nebulizer. He uses [facility] Has the patient had close contact with a person known or suspected to have the novel coronavirus illness OR traveled / lives in area with major community spread (including international travel) in the last 14 days from the onset of symptoms? * If Asymptomatic, screen for exposure and travel within the last 14 days. ---No Does the patient have any new or worsening symptoms? ---No Please document clinical information provided and list any resource used. ---Caller denies any new or worsening symptoms. He stated that he is running low on his medications and was needing a refill. Asked caller if he had enough medication to last until Monday and he stated that he did. Informed caller that he needs to call the office first thing Monday for refills on his meds  since he still has some left. Caller aware. Informed caller to call back if he ran out of meds or he starts having any symptoms. Caller aware and verbalized understanding. Guidelines Guideline Title Affirmed Question Affirmed Notes Nurse Date/Time (Eastern Time) Disp. Time Eilene Ghazi Time) Disposition Final User 10/01/2018 2:26:39 PM Clinical Call Yes Cherie Dark, RN, Jarrett Soho PLEASE NOTE:  All timestamps contained within this report are represented as [place] Standard Time. CONFIDENTIALTY NOTICE: This fax transmission is intended only for the addressee.  It contains information that is legally privileged, confidential or otherwise protected from use or disclosure.  If you are not the intended recipient, you are strictly prohibited from reviewing, disclosing, copying using or disseminating any of this information or taking any action in reliance on or regarding this information.  If you have received this fax in error, please notify us immediately by telephone so that we can arrange for its return to [place]. Phone:  [phone], Toll-Free:  [phone], Fax:  [phone] Page: 2 of 2 Call Id: [id number]

## 2019-01-23 ENCOUNTER — Other Ambulatory Visit: Payer: Self-pay | Admitting: Physician Assistant

## 2019-01-23 MED ORDER — SYMBICORT 160-4.5 MCG/ACT IN AERO: 2.0000 | INHALATION_SPRAY | Freq: Two times a day (BID) | RESPIRATORY_TRACT | 1 refills | Status: DC

## 2019-01-23 NOTE — Telephone Encounter (Signed)
Pt notified Rx sent to pharmacy and F/U scheduled for Friday.

## 2019-01-23 NOTE — Telephone Encounter (Signed)
Medication Refill: SYMBICORT 160-4.5 MCG/ACT inhaler [[id number]]     Pharmacy:  [facility] [address], [place] - [place] [phone] (Phone) [phone] (Fax)

## 2019-01-23 NOTE — Telephone Encounter (Signed)
See note

## 2019-01-27 ENCOUNTER — Ambulatory Visit: Payer: BC Managed Care – PPO | Admitting: Physician Assistant

## 2019-01-27 DIAGNOSIS — Z5329 Procedure and treatment not carried out because of patient's decision for other reasons: Secondary | ICD-10-CM

## 2019-01-27 DIAGNOSIS — Z91199 Patient's noncompliance with other medical treatment and regimen due to unspecified reason: Secondary | ICD-10-CM

## 2019-01-27 NOTE — Progress Notes (Signed)
No show

## 2019-05-17 LAB — NOVEL CORONAVIRUS, NAA: SARS-CoV-2, NAA: POSITIVE

## 2019-05-22 ENCOUNTER — Telehealth: Payer: Self-pay | Admitting: Physician Assistant

## 2019-05-22 NOTE — Telephone Encounter (Signed)
Spoke to pt asked him which inhaler does he need? Pt said he already got it. Told him okay. Pt said do I need to tell you I tested positive. Asked pt for COVID? He said yes. When? Pt said on Wednesday. Told him okay I will make note in your chart. Asked pt how he was feeling ? Pt said good. Told him okay please continue to stay home and rest and monitor symptoms. Pt verbalized understanding.

## 2019-05-22 NOTE — Telephone Encounter (Signed)
[place] at [place] [facility] Patient Name: Christopher Steele Gender: Male DOB: 11/19/1960 Age: 58 Y 2 M 22 D Return Phone Number: [phone] (Primary) Address: City/State/Zip: [place]  [address] Client [place] [facility] [place] Client Site [place] [facility] [place] Night Physician Orma Flaming- MD Contact Type Call Who Is Calling Patient / Member / Family / Caregiver Call Type Triage / Clinical Relationship To Patient Self Return Phone Number [phone] (Primary) Chief Complaint Prescription Refill or Medication Request (non symptomatic) Reason for Call Medication Question / Request Initial Comment The caller needs an inhaler and he has tested positive for corona virus. Translation No Nurse Assessment Nurse: Aris Lot, RN, [place] Date/Time (Eastern Time): 05/18/2019 2:24:00 AM Please select the assessment type ---Refill Additional Documentation ---Caller needs refill of albuterol inhaler Does the patient have enough medication to last until the office opens? ---No Additional Documentation ---Call office tomorrow Guidelines Guideline Title Affirmed Question Affirmed Notes Nurse Date/Time ([place] Time) Disp. Time Eilene Ghazi Time) Disposition Final User 05/18/2019 2:26:25 AM Clinical Call Yes Aris Lot, RN, [place]

## 2019-05-22 NOTE — Telephone Encounter (Signed)
[place] at [place] [facility] Patient Name: Christopher Steele Gender: Male DOB: [birth year] Age: 58 Y 2 M 22 D Return Phone Number: [phone] (Primary) Address: City/State/Zip: [place] [address] Client [place] [facility] [place] Client Site [place] [facility] [place] Night Physician Orma Flaming- MD Contact Type Call Who Is Calling Patient / Member / Family / Caregiver Call Type Triage / Clinical Relationship To Patient Self Return Phone Number [phone] (Primary) Chief Complaint Prescription Refill or Medication Request (non symptomatic) Reason for Call Symptomatic / Request for [place] states he calling yesterday about a inhaler refill. Caller states he needs his inhaler called in Translation No Nurse Assessment Nurse: Chestine Spore, RN, [place] Date/Time (Eastern Time): 05/18/2019 2:51:21 PM Please select the assessment type ---Refill Does the patient have enough medication to last until the office opens? ---Unable to obtain loaner dose from Pharmacy Does the client directives allow for assistance with medications after hours? ---No Additional Documentation ---caller states needs a refill of symbicort inhaler 160/4.5 mcg. called yesterday but pharmacy did not get order Guidelines Guideline Title Affirmed Question Affirmed Notes Nurse Date/Time ([place] Time) Disp. Time Eilene Ghazi Time) Disposition Final User 05/18/2019 2:23:23 PM Send To Nurse Milagros Reap, RN, Haynes Dage 05/18/2019 2:56:11 PM Pharmacy Call Chestine Spore, RN, Reynold Bowen Reason: refill 05/18/2019 2:56:22 PM Clinical Call Yes Chestine Spore, RN, Reynold Bowen Comments User: Cheri Kearns, RN Date/Time Eilene Ghazi Time): 05/18/2019 2:55:56 PM maintenance med. [facility] [phone]  PER [employer]

## 2019-05-22 NOTE — Telephone Encounter (Signed)
Duplicate

## 2019-06-07 ENCOUNTER — Other Ambulatory Visit: Payer: Self-pay | Admitting: *Deleted

## 2019-07-12 ENCOUNTER — Telehealth: Payer: Self-pay | Admitting: Physician Assistant

## 2019-07-12 NOTE — Telephone Encounter (Signed)
MEDICATION: Albuterol inhaler  PHARMACY: [facility] [phone]  Comments:   **Let patient know to contact pharmacy at the end of the day to make sure medication is ready. **  ** Please notify patient to allow 48-72 hours to process**  **Encourage patient to contact the pharmacy for refills or they can request refills through [facility]**

## 2019-07-13 MED ORDER — ALBUTEROL SULFATE HFA 108 (90 BASE) MCG/ACT IN AERS: 2.0000 | INHALATION_SPRAY | Freq: Four times a day (QID) | RESPIRATORY_TRACT | 0 refills | Status: DC | PRN

## 2019-07-13 NOTE — Telephone Encounter (Signed)
Spoke to pt told him refill for Albuterol was sent to pharmacy but no more refills till seen, overdue for an appt. Pt verbalized understanding and will call back and schedule.

## 2019-07-13 NOTE — Addendum Note (Signed)
Addended by: Marian Sorrow on: 07/13/2019 08:01 AM   Modules accepted: Orders

## 2019-07-18 ENCOUNTER — Other Ambulatory Visit: Payer: Self-pay | Admitting: Family Medicine

## 2019-07-18 ENCOUNTER — Telehealth: Payer: Self-pay | Admitting: Physician Assistant

## 2019-07-18 NOTE — Telephone Encounter (Signed)
° ° °  MEDICATION:SYMBICORT 160-4.5 MCG/ACT inhaler  PHARMACY: [facility] [place], [address] Phone:  [phone]  Fax:  [phone]       Comments: Patient stated that he needed this Inhaler sent in instead of Albuterol inhaler.   **Let patient know to contact pharmacy at the end of the day to make sure medication is ready. **  ** Please notify patient to allow 48-72 hours to process**  **Encourage patient to contact the pharmacy for refills or they can request refills through [facility]**

## 2019-07-18 NOTE — Telephone Encounter (Signed)
Rx sent to pharmacy   

## 2020-02-13 ENCOUNTER — Encounter: Payer: Self-pay | Admitting: Physician Assistant

## 2020-02-13 ENCOUNTER — Ambulatory Visit: Payer: BC Managed Care – PPO | Admitting: Physician Assistant

## 2020-02-13 ENCOUNTER — Other Ambulatory Visit: Payer: Self-pay

## 2020-02-13 VITALS — BP 150/80 | HR 67 | Temp 98.1°F | Ht 71.0 in | Wt 325.0 lb

## 2020-02-13 DIAGNOSIS — M25562 Pain in left knee: Secondary | ICD-10-CM | POA: Diagnosis not present

## 2020-02-13 DIAGNOSIS — J4530 Mild persistent asthma, uncomplicated: Secondary | ICD-10-CM

## 2020-02-13 DIAGNOSIS — R7309 Other abnormal glucose: Secondary | ICD-10-CM

## 2020-02-13 LAB — POCT GLYCOSYLATED HEMOGLOBIN (HGB A1C): Hemoglobin A1C: 4.9 % (ref 4.0–5.6)

## 2020-02-13 MED ORDER — PREDNISONE 20 MG PO TABS: 40.0000 mg | ORAL_TABLET | Freq: Every day | ORAL | 0 refills | Status: DC

## 2020-02-13 MED ORDER — SYMBICORT 160-4.5 MCG/ACT IN AERO: 2.0000 | INHALATION_SPRAY | Freq: Two times a day (BID) | RESPIRATORY_TRACT | 3 refills | Status: DC

## 2020-02-13 MED ORDER — ALBUTEROL SULFATE HFA 108 (90 BASE) MCG/ACT IN AERS: 2.0000 | INHALATION_SPRAY | Freq: Four times a day (QID) | RESPIRATORY_TRACT | 1 refills | Status: DC | PRN

## 2020-02-13 NOTE — Patient Instructions (Addendum)
It was great to see you!  Follow the instructions on the handout to care for your knee.  Start the oral prednisone for your knee. Your test does not show diabetes.  If no improvement in the next few days --> GO GET AN XRAY. An order for an xray has been put in for you. To get your xray, you can walk in at the [facility] location without a scheduled appointment. The address is [address]. It is across the street from [place] is located in the basement.  Hours of operation are M-F 8:30am to 5:00pm. Please note that they are closed for lunch between 12:30 and 1:00pm.   Let's follow-up in 1-2 months to review your health, sooner if you have concerns.  Take care,  [name] PA-C

## 2020-02-13 NOTE — Progress Notes (Signed)
Christopher Steele is a 59 y.o. male here for a new problem.  I acted as a Education administrator for [employer], PA-C Anselmo Pickler, LPN   History of Present Illness:   Chief Complaint  Patient presents with  . Knee Pain    HPI    Knee pain Pt c/o left knee pain, inside of knee. Worsened over the past few weeks. He spends multiple days on his feet and wears steel toe boots. He states hurts when he puts pressure on it. Pt is taking Ibuprofen 800 mg with no relief. Pt states he injured it about two years ago. He went to their doctors and was told it was fine took xrays and said it was bruised. Denies popping, clicking, feeling like his knee is going to give out.  Wt Readings from Last 5 Encounters:  02/13/20 (!) 325 lb (147.4 kg)  07/26/18 293 lb 6.1 oz (133.1 kg)  05/16/18 288 lb 12.8 oz (131 kg)  07/30/17 288 lb 12.8 oz (131 kg)  01/01/17 272 lb 6.4 oz (123.6 kg)   Obesity Has had weight gain during pandemic. Under significant family stress and admits to not taking care of himself.  Asthma Currently on symbicort and albuterol. Doing well. Needs refills. Denies recent exacerbations.  Past Medical History:  Diagnosis Date  . Asthma   . GERD (gastroesophageal reflux disease)   . Vitamin B 12 deficiency      Social History   Tobacco Use  . Smoking status: Never Smoker  . Smokeless tobacco: Never Used  Vaping Use  . Vaping Use: Never assessed  Substance Use Topics  . Alcohol use: Yes    Comment: rarely  . Drug use: No    Past Surgical History:  Procedure Laterality Date  . 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 THROAT SURGERY     polyp    Family History  Problem Relation Age of Onset  . Asthma Mother   . Hypertension Mother   . Diabetes Mother   . Heart disease Father   . Heart disease Sister   . Diabetes Sister   . High Cholesterol Sister   . Hypertension Sister   . Colon cancer Neg Hx   . Esophageal cancer Neg Hx   . Rectal cancer Neg Hx   . Stomach cancer Neg Hx    . Prostate cancer Neg Hx     No Known Allergies  Current Medications:   Current Outpatient Medications:  .  albuterol (PROAIR HFA) 108 (90 Base) MCG/ACT inhaler, Inhale 2 puffs into the lungs every 6 (six) hours as needed for wheezing or shortness of breath., Disp: 18 g, Rfl: 1 .  SYMBICORT 160-4.5 MCG/ACT inhaler, Inhale 2 puffs into the lungs 2 (two) times daily., Disp: 10.2 g, Rfl: 3 .  predniSONE (DELTASONE) 20 MG tablet, Take 2 tablets (40 mg total) by mouth daily., Disp: 10 tablet, Rfl: 0   Review of Systems:   ROS  Negative unless otherwise specified per HPI.   Vitals:   Vitals:   02/13/20 0906  BP: (!) 150/80  Pulse: 67  Temp: 98.1 F (36.7 C)  TempSrc: Temporal  SpO2: 96%  Weight: (!) 325 lb (147.4 kg)  Height: 5\' 11"  (1.803 m)     Body mass index is 45.33 kg/m.  Physical Exam:   Physical Exam Vitals and nursing note reviewed.  Constitutional:      Appearance: He is well-developed.  HENT:     Head: Normocephalic.  Eyes:  Conjunctiva/sclera: Conjunctivae normal.     Pupils: Pupils are equal, round, and reactive to light.  Pulmonary:     Effort: Pulmonary effort is normal.  Musculoskeletal:        General: Normal range of motion.     Cervical back: Normal range of motion.     Comments: L knee with FROM Lateral TTP Slight generalized swelling to L knee compared to R No TTP behind L knee or L calf swelling/tenderness  Skin:    General: Skin is warm and dry.  Neurological:     Mental Status: He is alert and oriented to person, place, and time.  Psychiatric:        Behavior: Behavior normal.        Thought Content: Thought content normal.        Judgment: Judgment normal.    Results for orders placed or performed in visit on 02/13/20  POCT HgB A1C  Result Value Ref Range   Hemoglobin A1C 4.9 4.0 - 5.6 %   HbA1c POC (<> result, manual entry)     HbA1c, POC (prediabetic range)     HbA1c, POC (controlled diabetic range)       Assessment  and Plan:   Jasin was seen today for knee pain.  Diagnoses and all orders for this visit:  Elevated glucose/Obesity HgbA1c is well controlled. Continue to work on diet and lifestyle. Follow-up in 1-2 months. -     POCT HgB A1C  Acute pain of left knee No red flags on exam. Will treat as a sprain. Elevate and ice leg. Continue compression. Start oral prednisone. Work note x 1 week to help provide rest. Xray order in, recommend that if he does not have improvement in a few days to get xray. -     DG Knee Complete 4 Views Left; Future  Mild persistent asthma without complication Currently well controlled with symbicort and proair.  Other orders -     SYMBICORT 160-4.5 MCG/ACT inhaler; Inhale 2 puffs into the lungs 2 (two) times daily. -     albuterol (PROAIR HFA) 108 (90 Base) MCG/ACT inhaler; Inhale 2 puffs into the lungs every 6 (six) hours as needed for wheezing or shortness of breath. -     predniSONE (DELTASONE) 20 MG tablet; Take 2 tablets (40 mg total) by mouth daily.   . Reviewed expectations re: course of current medical issues. . Discussed self-management of symptoms. . Outlined signs and symptoms indicating need for more acute intervention. . Patient verbalized understanding and all questions were answered. . See orders for this visit as documented in the electronic medical record. . Patient received an After-Visit Summary.  CMA or LPN served as scribe during this visit. History, Physical, and Plan performed by medical provider. The above documentation has been reviewed and is accurate and complete.   [name], PA-C

## 2020-02-20 ENCOUNTER — Ambulatory Visit (INDEPENDENT_AMBULATORY_CARE_PROVIDER_SITE_OTHER)
Admission: RE | Admit: 2020-02-20 | Discharge: 2020-02-20 | Disposition: A | Discharge status: Home / Self Care | Payer: BC Managed Care – PPO | Source: Ambulatory Visit | Attending: Physician Assistant | Admitting: Physician Assistant

## 2020-02-20 ENCOUNTER — Other Ambulatory Visit: Payer: Self-pay

## 2020-02-20 ENCOUNTER — Telehealth: Payer: Self-pay

## 2020-02-20 ENCOUNTER — Telehealth: Payer: Self-pay | Admitting: Physician Assistant

## 2020-02-20 DIAGNOSIS — M25562 Pain in left knee: Secondary | ICD-10-CM

## 2020-02-20 MED ORDER — PREDNISONE 20 MG PO TABS: 20.0000 mg | ORAL_TABLET | Freq: Two times a day (BID) | ORAL | 0 refills | Status: DC

## 2020-02-20 NOTE — Telephone Encounter (Signed)
Please see message and advise 

## 2020-02-20 NOTE — Addendum Note (Signed)
Addended by: Marian Sorrow on: 02/20/2020 03:55 PM   Modules accepted: Orders

## 2020-02-20 NOTE — Telephone Encounter (Signed)
  LAST APPOINTMENT DATE: 02/20/2020   NEXT APPOINTMENT DATE:@Visit  date not found  MEDICATION: predniSONE (DELTASONE) 20 MG tablet  PHARMACY: [place], [place] - [address]  COMMENTS: Patient would like to know if he can get a couple more days worth of this medication, states that it was helping but the pain has returned.

## 2020-02-20 NOTE — Telephone Encounter (Signed)
Spoke to pt told him Rx for prednisone sent to the pharmacy for 3 more days if no better please let us know and we can send you to Sports Medicine or Ortho. Pt verbalized understanding.

## 2020-02-20 NOTE — Telephone Encounter (Signed)
May add prednisone 20 mg BID x 3 days.

## 2020-02-20 NOTE — Telephone Encounter (Signed)
error 

## 2020-02-21 ENCOUNTER — Telehealth: Payer: Self-pay | Admitting: Physician Assistant

## 2020-02-21 NOTE — Telephone Encounter (Signed)
See result notes. 

## 2020-02-21 NOTE — Telephone Encounter (Signed)
Please see message and advise 

## 2020-02-21 NOTE — Telephone Encounter (Signed)
See result notes -- xray was overall normal with possible arthritis. If he is feeling better, he can return to work .

## 2020-02-21 NOTE — Telephone Encounter (Signed)
Patient is calling regarding x-ray results. Patient is also calling to see if he can return to work or if he should wait on results till he reutrns.

## 2020-03-14 ENCOUNTER — Telehealth: Payer: Self-pay

## 2020-03-14 DIAGNOSIS — G8929 Other chronic pain: Secondary | ICD-10-CM

## 2020-03-14 NOTE — Telephone Encounter (Signed)
[facility] for sports medicine referral

## 2020-03-14 NOTE — Telephone Encounter (Signed)
Pt wanted to let Christopher Steele know his return to work date was Sept 1st. He states he is still having some problems with his knee and mentioned a referral to a sports med provider.

## 2020-03-14 NOTE — Telephone Encounter (Signed)
Left detailed message on personal voicemail. Referral has been placed for Sports Medicine and someone will contact you to schedule an appt. Any questions please call the office.

## 2020-03-14 NOTE — Telephone Encounter (Signed)
Please see message pt would like a referral to Sports Medicine, okay to send?

## 2020-04-22 ENCOUNTER — Telehealth: Payer: Self-pay

## 2020-04-22 NOTE — Telephone Encounter (Signed)
LVM for patient to call back and schedule appt with [facility] for his FMLA.

## 2020-04-30 ENCOUNTER — Ambulatory Visit (INDEPENDENT_AMBULATORY_CARE_PROVIDER_SITE_OTHER): Payer: BC Managed Care – PPO | Admitting: Physician Assistant

## 2020-04-30 ENCOUNTER — Encounter: Payer: Self-pay | Admitting: Physician Assistant

## 2020-04-30 ENCOUNTER — Other Ambulatory Visit: Payer: Self-pay

## 2020-04-30 VITALS — BP 150/80 | HR 90 | Temp 98.6°F | Ht 71.0 in | Wt 336.2 lb

## 2020-04-30 DIAGNOSIS — L84 Corns and callosities: Secondary | ICD-10-CM

## 2020-04-30 DIAGNOSIS — M25562 Pain in left knee: Secondary | ICD-10-CM

## 2020-04-30 DIAGNOSIS — G8929 Other chronic pain: Secondary | ICD-10-CM

## 2020-04-30 NOTE — Progress Notes (Signed)
Christopher Steele is a 59 y.o. male is here to discuss FMLA paperwork.  I acted as a Education administrator for [employer], PA-C Anselmo Pickler, LPN   History of Present Illness:   Chief Complaint  Patient presents with  . FMLA paperwork    HPI   Pt is here today to have FMLA paperwork filled out for missing work in August for left knee.  Patient had knee pain that brought him to our office on 02/13/20. Please see my note for more details. He was treated with oral prednisone and also had imaging done. Xray performed 02/20/20 and showed mild degenerative change. He reports that his pain has significantly improved but does have lingering issues, such as stiffness when keeping his leg straight for long periods of time. He missed work from 02/13/20-02/21/20.  He also has callouses on plantar surfaces of bilateral feet and wants to see a podiatrist for this. Denies concerns for infection.  Health Maintenance Due  Topic Date Due  . COLONOSCOPY  10/27/2019    Past Medical History:  Diagnosis Date  . Asthma   . GERD (gastroesophageal reflux disease)   . Vitamin B 12 deficiency      Social History   Tobacco Use  . Smoking status: Never Smoker  . Smokeless tobacco: Never Used  Vaping Use  . Vaping Use: Never assessed  Substance Use Topics  . Alcohol use: Yes    Comment: rarely  . Drug use: No    Past Surgical History:  Procedure Laterality Date  . 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 THROAT SURGERY     polyp    Family History  Problem Relation Age of Onset  . Asthma Mother   . Hypertension Mother   . Diabetes Mother   . Heart disease Father   . Heart disease Sister   . Diabetes Sister   . High Cholesterol Sister   . Hypertension Sister   . Colon cancer Neg Hx   . Esophageal cancer Neg Hx   . Rectal cancer Neg Hx   . Stomach cancer Neg Hx   . Prostate cancer Neg Hx     PMHx, SurgHx, SocialHx, FamHx, Medications, and Allergies were reviewed in the Visit Navigator and updated  as appropriate.   Patient Active Problem List   Diagnosis Date Noted  . OSA (obstructive sleep apnea) 09/09/2016  . Morbid (severe) obesity due to excess calories ([place]) 09/09/2016  . Essential hypertension 09/02/2016  . GERD (gastroesophageal reflux disease) 09/02/2016  . Low hemoglobin 09/02/2016  . Vitamin B12 deficiency 09/02/2016  . Mild persistent asthma without complication 14/78/2956  . Chronic cough 05/21/2010    Social History   Tobacco Use  . Smoking status: Never Smoker  . Smokeless tobacco: Never Used  Vaping Use  . Vaping Use: Never assessed  Substance Use Topics  . Alcohol use: Yes    Comment: rarely  . Drug use: No    Current Medications and Allergies:    Current Outpatient Medications:  .  albuterol (PROAIR HFA) 108 (90 Base) MCG/ACT inhaler, Inhale 2 puffs into the lungs every 6 (six) hours as needed for wheezing or shortness of breath., Disp: 18 g, Rfl: 1 .  SYMBICORT 160-4.5 MCG/ACT inhaler, Inhale 2 puffs into the lungs 2 (two) times daily., Disp: 10.2 g, Rfl: 3  No Known Allergies  Review of Systems   ROS  Negative unless otherwise specified per HPI.  Vitals:   Vitals:   04/30/20 2130  BP: (!) 150/80  Pulse: 90  Temp: 98.6 F (37 C)  TempSrc: Temporal  SpO2: 95%  Weight: (!) 336 lb 4 oz (152.5 kg)  Height: 5\' 11"  (1.803 m)     Body mass index is 46.9 kg/m.   Physical Exam:    Physical Exam Vitals and nursing note reviewed.  Constitutional:      Appearance: He is well-developed.  HENT:     Head: Normocephalic.  Eyes:     Conjunctiva/sclera: Conjunctivae normal.     Pupils: Pupils are equal, round, and reactive to light.  Pulmonary:     Effort: Pulmonary effort is normal.  Musculoskeletal:        General: Normal range of motion.     Cervical back: Normal range of motion.  Skin:    General: Skin is warm and dry.  Neurological:     Mental Status: He is alert and oriented to person, place, and time.  Psychiatric:         Behavior: Behavior normal.        Thought Content: Thought content normal.        Judgment: Judgment normal.      Assessment and Plan:    [name] was seen today for fmla paperwork.  Diagnoses and all orders for this visit:  Chronic pain of left knee FMLA completed today to excuse him from his prior absence. Will also re-submit sports medicine referral for patient. He declines additional needs at this time. -     Ambulatory referral to Sports Medicine  Pre-ulcerative corn or callous Patient declined evaluation today, requesting referral to podiatry.  I have placed referral. -     Ambulatory referral to [facility] or LPN served as scribe during this visit. History, Physical, and Plan performed by medical provider. The above documentation has been reviewed and is accurate and complete.  [name], PA-C [place], [place] 04/30/2020  Follow-up: No follow-ups on file.

## 2020-05-13 ENCOUNTER — Other Ambulatory Visit: Payer: Self-pay

## 2020-05-13 ENCOUNTER — Ambulatory Visit (INDEPENDENT_AMBULATORY_CARE_PROVIDER_SITE_OTHER): Payer: BC Managed Care – PPO | Admitting: Podiatry

## 2020-05-13 DIAGNOSIS — L989 Disorder of the skin and subcutaneous tissue, unspecified: Secondary | ICD-10-CM | POA: Diagnosis not present

## 2020-05-13 DIAGNOSIS — B353 Tinea pedis: Secondary | ICD-10-CM | POA: Diagnosis not present

## 2020-05-13 MED ORDER — CLOTRIMAZOLE-BETAMETHASONE 1-0.05 % EX CREA: 1.0000 "application " | TOPICAL_CREAM | Freq: Two times a day (BID) | CUTANEOUS | 3 refills | Status: DC

## 2020-05-13 MED ORDER — TERBINAFINE HCL 250 MG PO TABS: 250.0000 mg | ORAL_TABLET | Freq: Every day | ORAL | 0 refills | Status: DC

## 2020-05-13 NOTE — Progress Notes (Signed)
   Subjective: 59 y.o. male presenting to the office today as a new patient for evaluation of symptomatic calluses as well as thick skin with peeling to the bilateral feet.  Patient states this has been an ongoing issue for several years now.  He currently has not done anything for treatment except for routine pedicures.  He presents for further treatment and evaluation   Past Medical History:  Diagnosis Date  . Asthma   . GERD (gastroesophageal reflux disease)   . Vitamin B 12 deficiency      Objective:  Physical Exam General: Alert and oriented x3 in no acute distress  Dermatology: Hyperkeratotic lesion(s) present on the weightbearing surfaces of the bilateral feet. Pain on palpation with a central nucleated core noted. Skin is warm, dry and supple bilateral lower extremities. Negative for open lesions or macerations.  There is also diffuse hyperkeratosis with xerosis noted to the bilateral feet  Vascular: Palpable pedal pulses bilaterally. No edema or erythema noted. Capillary refill within normal limits.  Neurological: Epicritic and protective threshold grossly intact bilaterally.   Musculoskeletal Exam: Pain on palpation at the keratotic lesion(s) noted. Range of motion within normal limits bilateral. Muscle strength 5/5 in all groups bilateral.  Assessment: 1.  Tinea pedis both 2.  Porokeratosis bilateral   Plan of Care:  1. Patient evaluated 2. Excisional debridement of keratoic lesion(s) using a chisel blade was performed without incident.  3.  Prescription for Lamisil 250 mg #30 daily 4.  Prescription for Lotrisone cream apply 2 times daily 5.  Return to clinic in 4 weeks.   Edrick Kins, DPM [facility]  Dr. Edrick Kins, [address]                                        [place], [place] [address]                Office [phone]  Fax [phone]

## 2020-05-15 ENCOUNTER — Telehealth: Payer: Self-pay

## 2020-05-15 ENCOUNTER — Telehealth: Payer: Self-pay | Admitting: Podiatry

## 2020-05-15 NOTE — Telephone Encounter (Signed)
Please see message and advise 

## 2020-05-15 NOTE — Telephone Encounter (Signed)
This medication is generally safe in that regard but if he has concerns he should reach out to the prescriber.

## 2020-05-15 NOTE — Telephone Encounter (Signed)
Spoke to pt told him per [name], This medication is generally safe in that regard but if he has concerns he should reach out to the prescriber.Pt verbalized understanding.

## 2020-05-15 NOTE — Telephone Encounter (Signed)
Patient called in inquiring about medication and patient has been placed on Prilosec for acid reflux and wanted to be sure he doesn't have interaction..Need more details/education on medicine for self care. If you could please call and speak with patient as requested to help understand, Please advise

## 2020-05-15 NOTE — Telephone Encounter (Signed)
Patient states [name] referred him to a foot doctor.    States he was prescribed terbinafine 250mg  and betacreme.    Would like to know if he should be taking these meds due to being board[place] line diabetic and has acid reflux.    Also states he read that the meds can cause heart attack.

## 2020-06-10 ENCOUNTER — Ambulatory Visit: Payer: BC Managed Care – PPO | Admitting: Podiatry

## 2020-06-17 ENCOUNTER — Other Ambulatory Visit: Payer: Self-pay | Admitting: *Deleted

## 2020-06-17 ENCOUNTER — Telehealth: Payer: Self-pay

## 2020-06-17 MED ORDER — ALBUTEROL SULFATE (2.5 MG/3ML) 0.083% IN NEBU: 2.5000 mg | INHALATION_SOLUTION | Freq: Four times a day (QID) | RESPIRATORY_TRACT | 2 refills | Status: DC | PRN

## 2020-06-17 NOTE — Telephone Encounter (Signed)
Pt is asking for solution that goes into the " machine". He states the " pump" is not working.

## 2020-06-17 NOTE — Telephone Encounter (Signed)
Spoke to pt told him Rx for Albuterol Nebulizer solution was sent to the pharmacy. Pt verbalized understanding.

## 2020-11-24 ENCOUNTER — Other Ambulatory Visit: Payer: Self-pay | Admitting: Physician Assistant

## 2021-01-14 ENCOUNTER — Other Ambulatory Visit: Payer: Self-pay | Admitting: Family Medicine

## 2021-01-14 NOTE — Telephone Encounter (Signed)
  LAST APPOINTMENT DATE: 04/30/2020  NEXT APPOINTMENT DATE:'@Visit'$  date not found  MEDICATION: SYMBICORT 160-4.5 MCG/ACT inhaler  Is the patient out of medication? YES   PHARMACY: [place], [place]

## 2021-02-26 ENCOUNTER — Other Ambulatory Visit: Payer: Self-pay | Admitting: Physician Assistant

## 2021-02-28 ENCOUNTER — Telehealth (INDEPENDENT_AMBULATORY_CARE_PROVIDER_SITE_OTHER): Payer: BC Managed Care – PPO | Admitting: Physician Assistant

## 2021-02-28 ENCOUNTER — Encounter: Payer: Self-pay | Admitting: Physician Assistant

## 2021-02-28 DIAGNOSIS — K219 Gastro-esophageal reflux disease without esophagitis: Secondary | ICD-10-CM

## 2021-02-28 DIAGNOSIS — J4530 Mild persistent asthma, uncomplicated: Secondary | ICD-10-CM

## 2021-02-28 MED ORDER — SYMBICORT 160-4.5 MCG/ACT IN AERO: 2.0000 | INHALATION_SPRAY | Freq: Two times a day (BID) | RESPIRATORY_TRACT | 6 refills | Status: DC

## 2021-02-28 MED ORDER — ALBUTEROL SULFATE (2.5 MG/3ML) 0.083% IN NEBU: 2.5000 mg | INHALATION_SOLUTION | Freq: Four times a day (QID) | RESPIRATORY_TRACT | 2 refills | Status: DC | PRN

## 2021-02-28 MED ORDER — ALBUTEROL SULFATE HFA 108 (90 BASE) MCG/ACT IN AERS: 2.0000 | INHALATION_SPRAY | Freq: Four times a day (QID) | RESPIRATORY_TRACT | 3 refills | Status: AC | PRN

## 2021-02-28 NOTE — Progress Notes (Signed)
Virtual Visit via Video   I connected with Christopher Steele on 02/28/21 at 10:30 AM EDT by a video enabled telemedicine application and verified that I am speaking with the correct person using two identifiers. Location patient: Home Location provider: [facility], Office Persons participating in the virtual visit: Christopher Steele, [place] PA-C  I discussed the limitations of evaluation and management by telemedicine and the availability of in person appointments. The patient expressed understanding and agreed to proceed.  Subjective:   HPI:   GERD Patient reports that sometimes he wakes up with a little bit of a cough and heartburn.  He takes Prilosec as needed.  He states that this medication is effective when he does take it.  He is wondering if he should take this every day.  He denies: Changes in bowels, nausea, vomiting, lower abdominal pain.  Moderate persistent asthma Patient reports that he is doing well with his current regimen of Symbicort 2 puffs twice daily and as needed albuterol.  He has not had to use his albuterol inhaler or nebulizer in quite some time.  He denies any wheezing, shortness of breath, chest tightness.  ROS: See pertinent positives and negatives per HPI.  Patient Active Problem List   Diagnosis Date Noted   OSA (obstructive sleep apnea) 09/09/2016   Morbid (severe) obesity due to excess calories ([place]) 09/09/2016   Essential hypertension 09/02/2016   GERD (gastroesophageal reflux disease) 09/02/2016   Low hemoglobin 09/02/2016   Vitamin B12 deficiency 09/02/2016   Mild persistent asthma without complication [id number]   Chronic cough 05/21/2010    Social History   Tobacco Use   Smoking status: Never   Smokeless tobacco: Never  Substance Use Topics   Alcohol use: Yes    Comment: rarely    Current Outpatient Medications:    albuterol (PROAIR HFA) 108 (90 Base) MCG/ACT inhaler, Inhale 2 puffs into the lungs every 6 (six) hours as needed  for wheezing or shortness of breath., Disp: 18 g, Rfl: 3   albuterol (PROVENTIL) (2.5 MG/3ML) 0.083% nebulizer solution, Take 3 mLs (2.5 mg total) by nebulization every 6 (six) hours as needed for wheezing or shortness of breath., Disp: 75 mL, Rfl: 2   SYMBICORT 160-4.5 MCG/ACT inhaler, Inhale 2 puffs into the lungs 2 (two) times daily., Disp: 10 g, Rfl: 6  No Known Allergies  Objective:   VITALS: Per patient if applicable, see vitals. GENERAL: Alert, appears well and in no acute distress. HEENT: Atraumatic, conjunctiva clear, no obvious abnormalities on inspection of external nose and ears. NECK: Normal movements of the head and neck. CARDIOPULMONARY: No increased WOB. Speaking in clear sentences. I:E ratio WNL.  MS: Moves all visible extremities without noticeable abnormality. PSYCH: Pleasant and cooperative, well-groomed. Speech normal rate and rhythm. Affect is appropriate. Insight and judgement are appropriate. Attention is focused, linear, and appropriate.  NEURO: CN grossly intact. Oriented as arrived to appointment on time with no prompting. Moves both UE equally.  SKIN: No obvious lesions, wounds, erythema, or cyanosis noted on face or hands.  Assessment and Plan:   [name] was seen today for medication refill .  Diagnoses and all orders for this visit:  Gastroesophageal reflux disease, unspecified whether esophagitis present Uncontrolled Recommend taking over-the-counter Prilosec 20 mg daily If symptoms do not improve, recommend follow-up in office  Mild persistent asthma without complication Well-controlled Continue Symbicort 2 puffs in the morning and at night Albuterol as needed Refills sent  Other orders -  albuterol (PROAIR HFA) 108 (90 Base) MCG/ACT inhaler; Inhale 2 puffs into the lungs every 6 (six) hours as needed for wheezing or shortness of breath. -     albuterol (PROVENTIL) (2.5 MG/3ML) 0.083% nebulizer solution; Take 3 mLs (2.5 mg total) by nebulization  every 6 (six) hours as needed for wheezing or shortness of breath. -     SYMBICORT 160-4.5 MCG/ACT inhaler; Inhale 2 puffs into the lungs 2 (two) times daily.   I discussed the assessment and treatment plan with the patient. The patient was provided an opportunity to ask questions and all were answered. The patient agreed with the plan and demonstrated an understanding of the instructions.   The patient was advised to call back or seek an in-person evaluation if the symptoms worsen or if the condition fails to improve as anticipated.    [place], [place] 02/28/2021

## 2021-07-15 IMAGING — DX DG KNEE COMPLETE 4+V*L*
4 series · 4 of 4 positions shown · non-contrast
Comparison: None.

CLINICAL DATA: Lt knee pain intermittent x 1 yr, AND worsening
recently, medial/anterior pain, nki.

EXAM:
LEFT KNEE - COMPLETE 4+ VIEW

[knee ap]
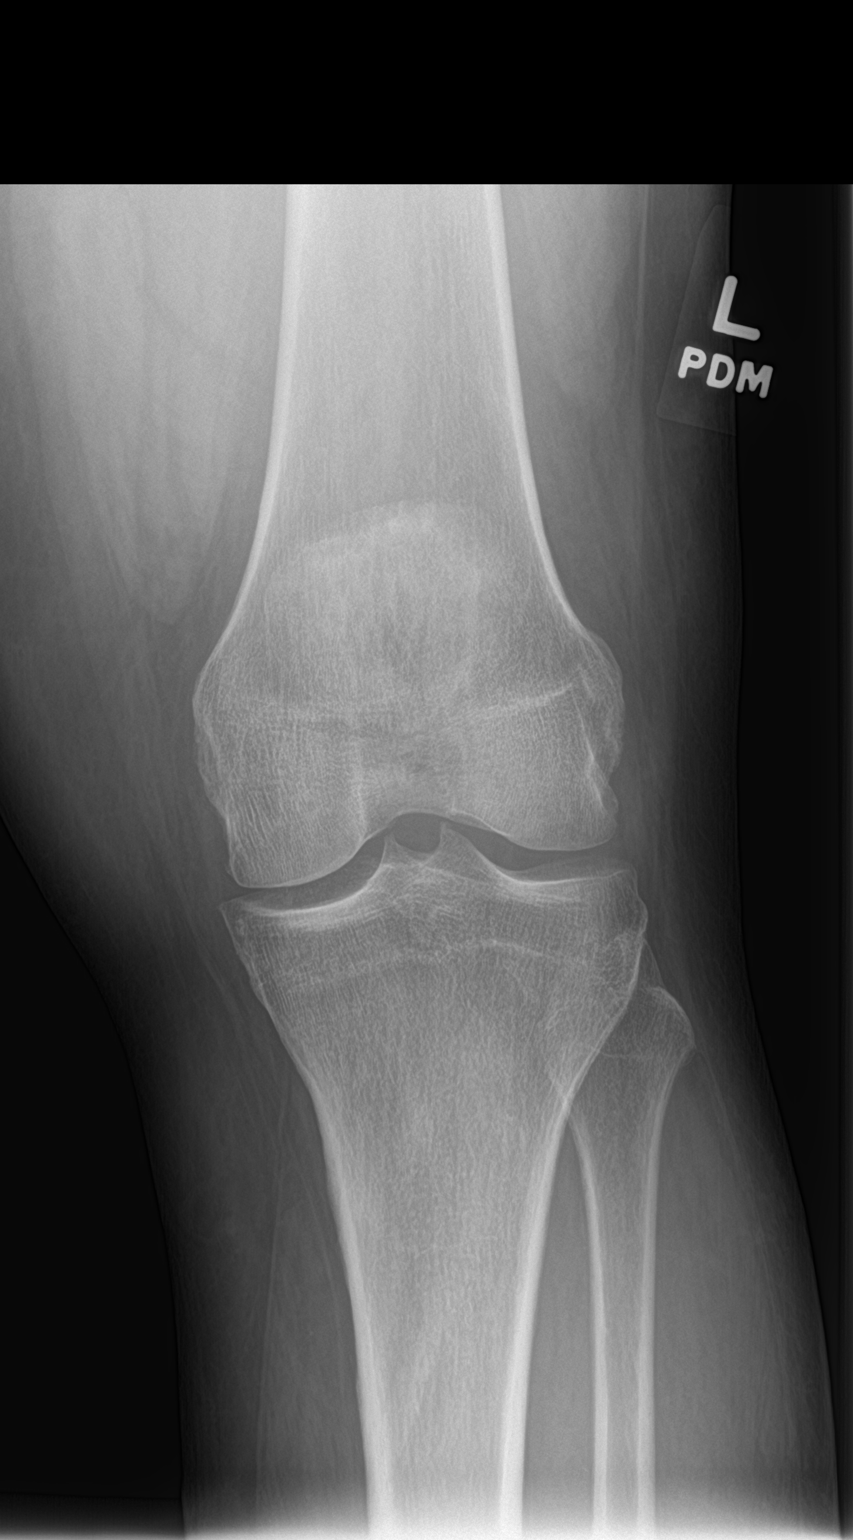

[knee tunnel]
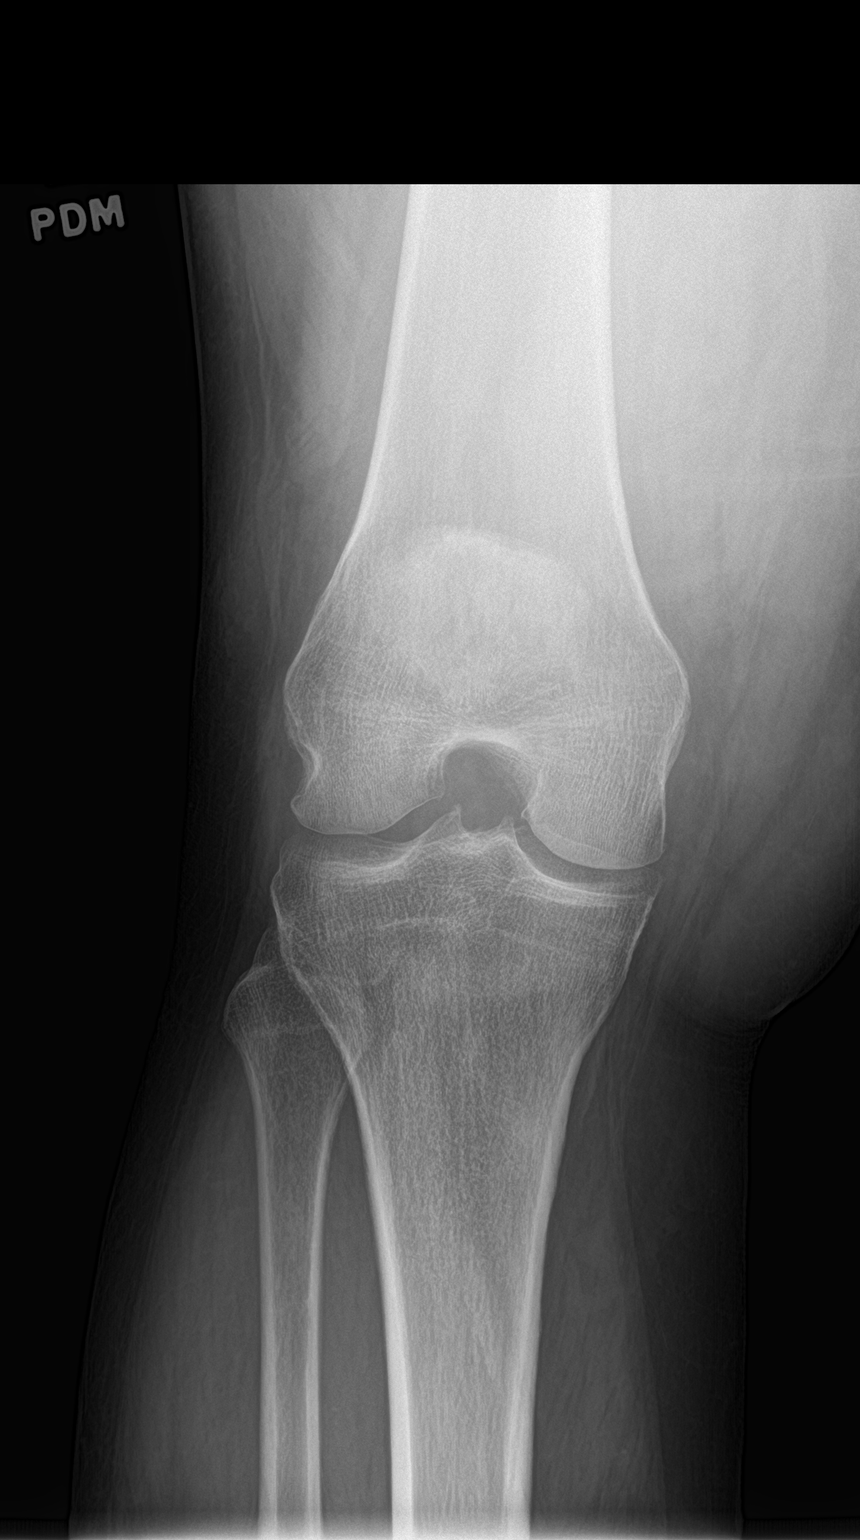

[knee lat]
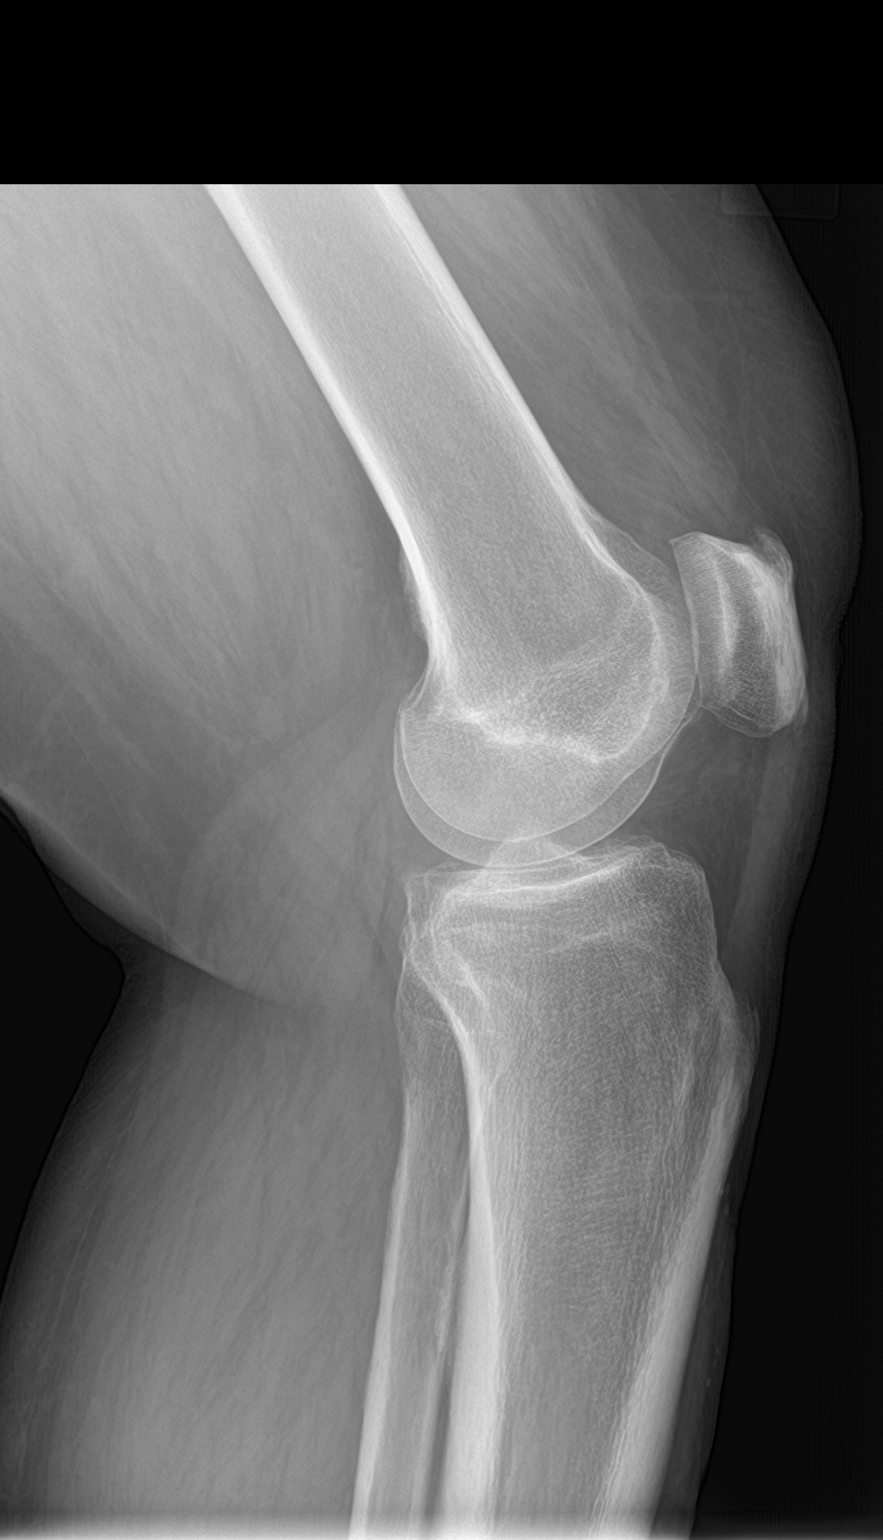

[sunrise]
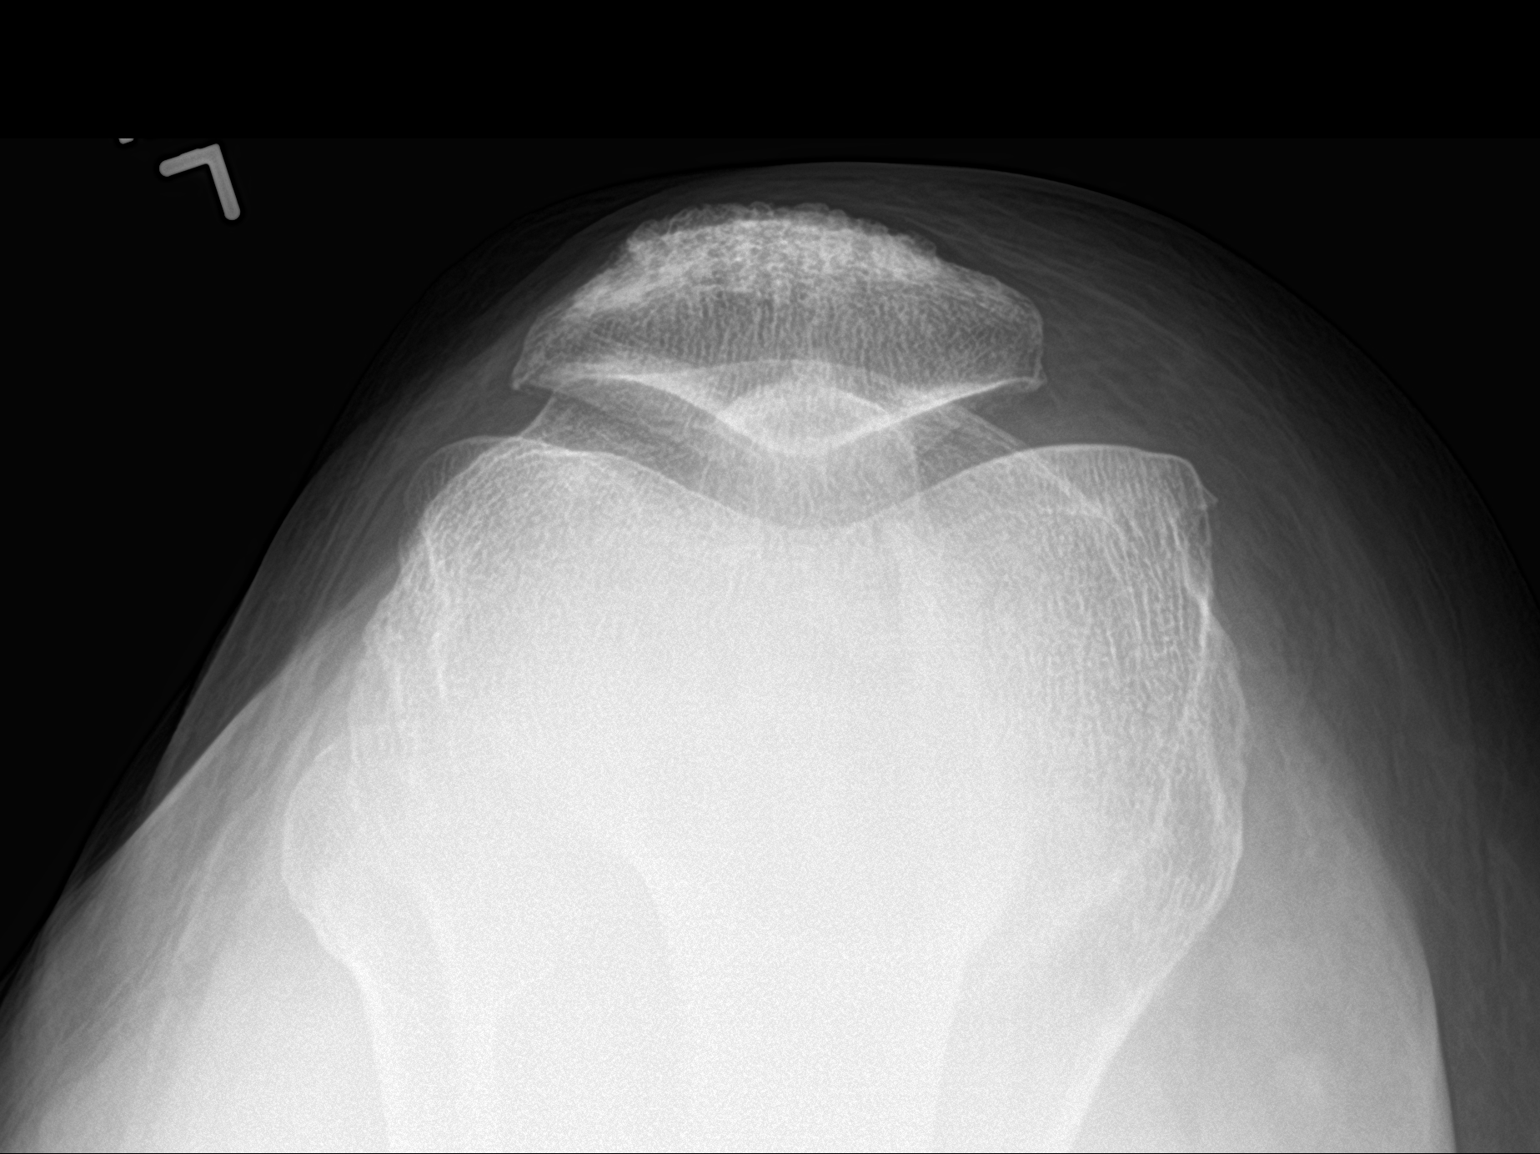

[4 of 4 positions shown; findings below may reference images not displayed]

FINDINGS: No evidence of fracture, dislocation, or joint effusion. Minimal
degenerative spurring. No focal bone lesion. Soft tissues are
unremarkable.
IMPRESSION: No acute osseous abnormality in the left knee. Minimal degenerative
change.

## 2021-10-18 ENCOUNTER — Other Ambulatory Visit: Payer: Self-pay | Admitting: Physician Assistant

## 2021-10-22 ENCOUNTER — Other Ambulatory Visit: Payer: Self-pay

## 2021-10-22 MED ORDER — ALBUTEROL SULFATE (2.5 MG/3ML) 0.083% IN NEBU: INHALATION_SOLUTION | RESPIRATORY_TRACT | 0 refills | Status: AC

## 2022-06-02 ENCOUNTER — Ambulatory Visit: Payer: BC Managed Care – PPO | Admitting: Internal Medicine

## 2022-06-02 ENCOUNTER — Encounter: Payer: Self-pay | Admitting: Internal Medicine

## 2022-06-02 VITALS — BP 113/74 | HR 79 | Temp 98.7°F | Resp 14 | Ht 71.0 in | Wt 321.2 lb

## 2022-06-02 DIAGNOSIS — J320 Chronic maxillary sinusitis: Secondary | ICD-10-CM

## 2022-06-02 MED ORDER — DEXTROMETHORPHAN-GUAIFENESIN 10-100 MG/5ML PO LIQD: 10.0000 mL | ORAL | 1 refills | Status: DC | PRN

## 2022-06-02 MED ORDER — AZITHROMYCIN 250 MG PO TABS: ORAL_TABLET | ORAL | 0 refills | Status: AC

## 2022-06-02 MED ORDER — SIMPLY SALINE 0.9 % NA AERS: 2.0000 | INHALATION_SPRAY | NASAL | 11 refills | Status: AC

## 2022-06-02 MED ORDER — PSEUDOEPHEDRINE HCL ER 120 MG PO TB12: 120.0000 mg | ORAL_TABLET | Freq: Two times a day (BID) | ORAL | 0 refills | Status: DC

## 2022-06-02 MED ORDER — FLUTICASONE PROPIONATE 50 MCG/ACT NA SUSP: 2.0000 | Freq: Every day | NASAL | 6 refills | Status: DC

## 2022-06-02 MED ORDER — LORATADINE 10 MG PO TABS: 10.0000 mg | ORAL_TABLET | Freq: Every day | ORAL | 11 refills | Status: DC

## 2022-06-02 NOTE — Progress Notes (Signed)
[facility]: [phone]   Acute Care Medical Office Visit  Patient:  Christopher Steele      Age: 61 y.o.       Sex:  male  Date:   06/02/2022  PCP:    [name], [place] Provider: Loralee Pacas, MD  Assessment/Plan:    Sinus infection/Sinusitis Most likely viral or allergic based on <10 days, no double sickening, lack of severity of symptoms in first 3 days. Educated on signs that bacterial infection may have developed (symptoms over 10 days, double sickening).   He declined flu/COVID swabs which were recommended . History of Z-pack helping and pustules in the nasal mucosae tilted me towards giving that, but advising he try to clear it up with the other medications first.  Treatment: -symptomatic care with medications ordered below Plain mucinex or mucinex- DM (if you want to have something to help with cough as well) Aggressive sinus rinses preferably with misting saline can spray Tylenol 650 mg every 6 hours to help with sinus pressure  -Antibiotic indicated:  Not at present-but Z-pack worked in past and if he doesn't improve with conservative therapy he is welcome to try it.  Meds ordered this encounter  Medications   fluticasone (FLONASE) 50 MCG/ACT nasal spray    Sig: Place 2 sprays into both nostrils daily.    Dispense:  16 g    Refill:  6   Saline (SIMPLY SALINE) 0.9 % AERS    Sig: Place 2 each into the nose as directed. Use nightly for sinus hygiene long-term.  Can also be used as many times daily as desired to assist with clearing congested sinuses.    Dispense:  127 mL    Refill:  11   loratadine (CLARITIN) 10 MG tablet    Sig: Take 1 tablet (10 mg total) by mouth daily.    Dispense:  30 tablet    Refill:  11   pseudoephedrine (SUDAFED 12 HOUR) 120 MG 12 hr tablet    Sig: Take 1 tablet (120 mg total) by mouth 2 (two) times daily.    Dispense:  20 tablet    Refill:  0   azithromycin (ZITHROMAX) 250 MG tablet    Sig:  Take 2 tablets on day 1, then 1 tablet daily on days 2 through 5    Dispense:  6 tablet    Refill:  0   dextromethorphan-guaiFENesin (TUSSIN DM) 10-100 MG/5ML liquid    Sig: Take 10 mLs by mouth every 4 (four) hours as needed for cough.    Dispense:  180 mL    Refill:  1    Follow up as needed, based on symptom(s) progression or failure to resolve.     Today's key discussion points and After Visit Summary (AVS) reminders.  He was encouraged to contact our office by phone or message via MyChart if he has any questions or concerns regarding our treatment plan (see AVS). He was given an opportunity to ask questions/clarifications about any aspect of the diagnosis and treatment plan at today's visit. Common side effects, risks, benefits, and alternatives for medications and treatment plan prescribed today were discussed. He expressed understanding of the given instructions.  We discussed the importance of daily sinus rinsing, to mitigate the spread of illness.  A 1-3 day work excuse was given- COVID/flu testing offered/declined  No barriers to understanding were identified     Subjective:   Christopher Steele is a 61 y.o. male  with past medical history significant for Patient Active Problem List   Diagnosis Date Noted   OSA (obstructive sleep apnea) 09/09/2016   Morbid (severe) obesity due to excess calories ([place]) 09/09/2016   Essential hypertension 09/02/2016   GERD (gastroesophageal reflux disease) 09/02/2016   Low hemoglobin 09/02/2016   Vitamin B12 deficiency 09/02/2016   Mild persistent asthma without complication 35/46/5681   Chronic cough 05/21/2010    Reviewed prior medications as listed: Outpatient Medications Prior to Visit  Medication Sig   albuterol (PROVENTIL) (2.5 MG/3ML) 0.083% nebulizer solution USE 1 VIAL IN NEBULIZER EVERY 6 HOURS AS NEEDED FOR WHEEZING FOR SHORTNESS OF BREATH   albuterol (PROAIR HFA) 108 (90 Base) MCG/ACT inhaler Inhale 2 puffs into the lungs every 6  (six) hours as needed for wheezing or shortness of breath.   SYMBICORT 160-4.5 MCG/ACT inhaler Inhale 2 puffs into the lungs 2 (two) times daily.   No facility-administered medications prior to visit.     He presented today for evaluation of: Chief Complaint  Patient presents with   Nasal Congestion    Slight runny nose and no fever.   Cough    Symptoms for about three days, gets this every year (is usually prescribed prednisone and a z-pack).   Medication Refill    Symbicort and albuterol inhaler.     HPI  -other symptoms include: runny nose only with nebs, coughing up a little mucus and coughing -first symptoms of this illness appeared 4 days ago, but has had am cough for long time now months maybe years. -Symptoms show no change -previous treatments: neb treatments, inhaler Symbicort. -sick contacts/travel/risks: denies flu/COVID exposure.  -Hx of: denies allergies  ROS - denies fevers, shortness of breath, nausea, vomiting, diarrhea, tooth pain, sore throat       Objective:  BP 113/74 (BP Location: Left Arm, Patient Position: Sitting)   Pulse 79   Temp 98.7 F (37.1 C) (Temporal)   Resp 14   Ht '5\' 11"'$  (1.803 m)   Wt (!) 321 lb 3.2 oz (145.7 kg)   SpO2 96%   BMI 44.80 kg/m  Physical Exam was problem focused only:  Severely obese male in no acute distress no acute distress,  awake alert and oriented, uncomfortable but not toxic-appearing, was asleep when I entered the room- had some coughing- impression is that its interfered with his sleep, he was easily arousable and alert throughout rest of visit  normal work of breathing, Lungs were clear to auscultation bilaterally Heart was Normal heart rate. Normal rhythm. No murmurs, rubs, or gallops.   Oral mucosa was moist without significant tonsillar swelling or exudate Turbinates erythematous with yellow nasal drainage there are multiple tiny abscesses in his nasal mucosa seen, pharynx mildly erythematous with no  tonsillar exudate or edema, no sinus tenderness  Results Reviewed: Results for orders placed or performed in visit on 02/13/20  POCT HgB A1C  Result Value Ref Range   Hemoglobin A1C 4.9 4.0 - 5.6 %   HbA1c POC (<> result, manual entry)     HbA1c, POC (prediabetic range)     HbA1c, POC (controlled diabetic range)        Loralee Pacas, MD

## 2022-09-15 ENCOUNTER — Ambulatory Visit (HOSPITAL_COMMUNITY)
Admission: EM | Admit: 2022-09-15 | Discharge: 2022-09-15 | Disposition: A | Discharge status: Home / Self Care | Payer: BC Managed Care – PPO | Attending: Emergency Medicine | Admitting: Emergency Medicine

## 2022-09-15 ENCOUNTER — Encounter (HOSPITAL_COMMUNITY): Payer: Self-pay

## 2022-09-15 DIAGNOSIS — Z23 Encounter for immunization: Secondary | ICD-10-CM | POA: Diagnosis not present

## 2022-09-15 DIAGNOSIS — S20462A Insect bite (nonvenomous) of left back wall of thorax, initial encounter: Secondary | ICD-10-CM

## 2022-09-15 DIAGNOSIS — W57XXXA Bitten or stung by nonvenomous insect and other nonvenomous arthropods, initial encounter: Secondary | ICD-10-CM | POA: Diagnosis not present

## 2022-09-15 DIAGNOSIS — L02212 Cutaneous abscess of back [any part, except buttock]: Secondary | ICD-10-CM

## 2022-09-15 DIAGNOSIS — L0291 Cutaneous abscess, unspecified: Secondary | ICD-10-CM

## 2022-09-15 MED ORDER — LIDOCAINE-EPINEPHRINE 1 %-1:100000 IJ SOLN
INTRAMUSCULAR | Status: AC
  Filled 2022-09-15: qty 1

## 2022-09-15 MED ORDER — DOXYCYCLINE HYCLATE 100 MG PO CAPS: 100.0000 mg | ORAL_CAPSULE | Freq: Two times a day (BID) | ORAL | 0 refills | Status: AC

## 2022-09-15 MED ORDER — TETANUS-DIPHTH-ACELL PERTUSSIS 5-2.5-18.5 LF-MCG/0.5 IM SUSY
0.5000 mL | PREFILLED_SYRINGE | Freq: Once | INTRAMUSCULAR | Status: AC
  Administered 2022-09-15: 0.5 mL via INTRAMUSCULAR

## 2022-09-15 MED ORDER — TETANUS-DIPHTH-ACELL PERTUSSIS 5-2.5-18.5 LF-MCG/0.5 IM SUSY
PREFILLED_SYRINGE | INTRAMUSCULAR | Status: AC
  Filled 2022-09-15: qty 0.5

## 2022-09-15 NOTE — Discharge Instructions (Signed)
We have updated your tetanus shot today.  Please take the doxycyline as prescribed. Finish the full course. You shouldn't have any leftover pills. Take with food to avoid upset stomach.  Clean the area twice daily (soap and water) Apply antibiotic ointment twice daily (bacitracin, neosporin, etc)  Please return with any concerns

## 2022-09-15 NOTE — ED Provider Notes (Signed)
[facility]    CSN: [id number] Arrival date & time: 09/15/22  1241      History   Chief Complaint Chief Complaint  Patient presents with   Insect Bite    HPI Christopher Steele is a 62 y.o. male.  Here with concerns for possible "spider bite" Has an area of redness, swelling, pain on the left upper back Started 2 days ago after moving boxes in the garage Denies any drainage from the area  Past Medical History:  Diagnosis Date   Asthma    GERD (gastroesophageal reflux disease)    Vitamin B 12 deficiency     Patient Active Problem List   Diagnosis Date Noted   OSA (obstructive sleep apnea) 09/09/2016   Morbid (severe) obesity due to excess calories ([place]) 09/09/2016   Essential hypertension 09/02/2016   GERD (gastroesophageal reflux disease) 09/02/2016   Low hemoglobin 09/02/2016   Vitamin B12 deficiency 09/02/2016   Mild persistent asthma without complication [id number]   Chronic cough 05/21/2010    Past Surgical History:  Procedure Laterality Date   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 Authorizing Provider  doxycycline (VIBRAMYCIN) 100 MG capsule Take 1 capsule (100 mg total) by mouth 2 (two) times daily for 7 days. 09/15/22 09/22/22 Yes Latora Quarry, Wells Guiles, PA-C  albuterol (PROAIR HFA) 108 (90 Base) MCG/ACT inhaler Inhale 2 puffs into the lungs every 6 (six) hours as needed for wheezing or shortness of breath. 02/28/21   [name], PA  albuterol (PROVENTIL) (2.5 MG/3ML) 0.083% nebulizer solution USE 1 VIAL IN NEBULIZER EVERY 6 HOURS AS NEEDED FOR WHEEZING FOR SHORTNESS OF BREATH 10/22/21   [name], PA  Saline (SIMPLY SALINE) 0.9 % AERS Place 2 each into the nose as directed. Use nightly for sinus hygiene long-term.  Can also be used as many times daily as desired to assist with clearing congested sinuses. 06/02/22    Loralee Pacas, MD  SYMBICORT 160-4.5 MCG/ACT inhaler Inhale 2 puffs into the lungs 2 (two) times daily. 02/28/21   [name], PA    Family History Family History  Problem Relation Age of Onset   Asthma Mother    Hypertension Mother    Diabetes Mother    Heart disease Father    Heart disease Sister    Diabetes Sister    High Cholesterol Sister    Hypertension Sister    Colon cancer Neg Hx    Esophageal cancer Neg Hx    Rectal cancer Neg Hx    Stomach cancer Neg Hx    Prostate cancer Neg Hx     Social History Social History   Tobacco Use   Smoking status: Never   Smokeless tobacco: Never  Substance Use Topics   Alcohol use: Yes    Comment: rarely   Drug use: No     Allergies   Patient has no known allergies.   Review of Systems Review of Systems As per HPI  Physical Exam Triage Vital Signs ED Triage Vitals  Enc Vitals Group     BP 09/15/22 1326 (!) 143/83     Pulse Rate 09/15/22 1326 70     Resp 09/15/22 1326 16     Temp 09/15/22 1326 98.6 F (37 C)     Temp Source 09/15/22 1326 Oral  SpO2 09/15/22 1326 96 %     Weight --      Height --      Head Circumference --      Peak Flow --      Pain Score 09/15/22 1327 0     Pain Loc --      Pain Edu? --      Excl. in [place]? --    No data found.  Updated Vital Signs BP (!) 143/83 (BP Location: Right Arm)   Pulse 70   Temp 98.6 F (37 C) (Oral)   Resp 16   SpO2 96%   Physical Exam Vitals and nursing note reviewed.  Constitutional:      General: He is not in acute distress. HENT:     Mouth/Throat:     Mouth: Mucous membranes are moist.     Pharynx: Oropharynx is clear. No posterior oropharyngeal erythema.  Eyes:     Conjunctiva/sclera: Conjunctivae normal.  Cardiovascular:     Rate and Rhythm: Normal rate and regular rhythm.     Heart sounds: Normal heart sounds.  Pulmonary:     Effort: Pulmonary effort is normal.     Breath sounds: Normal breath sounds.  Musculoskeletal:         General: Normal range of motion.     Cervical back: Normal range of motion.  Skin:    Findings: Abscess and erythema present.          Comments: Indurated area of left upper back. Erythematous and warm. Live tick centrally. Tick is not swollen.  Neurological:     Mental Status: He is alert and oriented to person, place, and time.     UC Treatments / Results  Labs (all labs ordered are listed, but only abnormal results are displayed) Labs Reviewed - No data to display  EKG  Radiology No results found.  Procedures Foreign Body Removal  Date/Time: 09/15/2022 2:08 PM  Performed by: Les Pou, PA-C Authorized by: Les Pou, PA-C   Consent:    Consent obtained:  Verbal   Consent given by:  Patient   Risks, benefits, and alternatives were discussed: yes     Risks discussed:  Incomplete removal Universal protocol:    Patient identity confirmed:  Verbally with patient Location:    Location:  Trunk   Trunk location:  Upper back (Left)   Depth:  Intradermal Anesthesia:    Anesthesia method:  Local infiltration   Local anesthetic:  Lidocaine 1% WITH epi Procedure type:    Procedure complexity:  Simple Procedure details:    Incision length:  1.5 mm   Localization method:  Visualized   Removal mechanism:  Forceps and irrigation   Foreign bodies recovered:  1   Description:  Tick head and jaws   Intact foreign body removal: yes (body removed first. head intact removal)   Post-procedure details:    Confirmation:  No additional foreign bodies on visualization   Skin closure:  None   Dressing:  Antibiotic ointment, non-adherent dressing and adhesive bandage   Procedure completion:  Tolerated   Medications Ordered in UC Medications  Tdap (BOOSTRIX) injection 0.5 mL (0.5 mLs Intramuscular Given 09/15/22 1408)    Initial Impression / Assessment and Plan / UC Course  I have reviewed the triage vital signs and the nursing notes.  Pertinent labs & imaging results  that were available during my care of the patient were reviewed by me and considered in my medical decision making (see chart for details).  Body was grasped with forceps close to the bite area but removed without head. Tick head then removed as documented above.  Tetanus updated today. Doxy BID x 7 days Discussed wound care and signs of worsening infection. Return precautions discussed. Patient agrees to plan  Final Clinical Impressions(s) / UC Diagnoses   Final diagnoses:  Tick bite of left back wall of thorax, initial encounter  Abscess     Discharge Instructions      We have updated your tetanus shot today.  Please take the doxycyline as prescribed. Finish the full course. You shouldn't have any leftover pills. Take with food to avoid upset stomach.  Clean the area twice daily (soap and water) Apply antibiotic ointment twice daily (bacitracin, neosporin, etc)  Please return with any concerns      ED Prescriptions     Medication Sig Dispense Auth. Provider   doxycycline (VIBRAMYCIN) 100 MG capsule Take 1 capsule (100 mg total) by mouth 2 (two) times daily for 7 days. 14 capsule Jawad Wiacek, Wells Guiles, PA-C      PDMP not reviewed this encounter.   Les Pou, [place] 09/15/22 1454

## 2022-09-15 NOTE — ED Triage Notes (Signed)
Pt states possible spider bite left upper back for the past 2 days.  Area of redness,swelling and warmth noted to left upper back,no drainage noted.

## 2023-07-14 ENCOUNTER — Other Ambulatory Visit: Payer: Self-pay | Admitting: Internal Medicine

## 2023-07-14 DIAGNOSIS — J320 Chronic maxillary sinusitis: Secondary | ICD-10-CM

## 2023-11-17 ENCOUNTER — Other Ambulatory Visit: Payer: Self-pay | Admitting: Internal Medicine

## 2023-11-17 DIAGNOSIS — J320 Chronic maxillary sinusitis: Secondary | ICD-10-CM

## 2023-11-27 ENCOUNTER — Ambulatory Visit
Admission: EM | Admit: 2023-11-27 | Discharge: 2023-11-27 | Disposition: A | Discharge status: Home / Self Care | Attending: Internal Medicine | Admitting: Internal Medicine

## 2023-11-27 ENCOUNTER — Other Ambulatory Visit: Payer: Self-pay

## 2023-11-27 ENCOUNTER — Encounter: Payer: Self-pay | Admitting: Emergency Medicine

## 2023-11-27 DIAGNOSIS — H6122 Impacted cerumen, left ear: Secondary | ICD-10-CM | POA: Diagnosis not present

## 2023-11-27 DIAGNOSIS — J0100 Acute maxillary sinusitis, unspecified: Secondary | ICD-10-CM

## 2023-11-27 MED ORDER — AMOXICILLIN 875 MG PO TABS: 875.0000 mg | ORAL_TABLET | Freq: Two times a day (BID) | ORAL | 0 refills | Status: AC

## 2023-11-27 MED ORDER — OFLOXACIN 0.3 % OT SOLN: 2.0000 [drp] | Freq: Two times a day (BID) | OTIC | 0 refills | Status: DC

## 2023-11-27 NOTE — ED Triage Notes (Signed)
 Pt sts URI sx x 10 days with nasal congestion remaining; pt sts took neb treatment this am with relief

## 2023-11-27 NOTE — Discharge Instructions (Addendum)
 Symptoms and physical exam findings consistent maxillary sinusitis.  Given duration of symptoms we will treat with the following: Amoxicillin 875 mg twice daily for 10 days.  This is an antibiotic.  Take this with food.  Continue Flonase  2 sprays each nostril once daily for nasal congestion. Also with impacted left ear canal, we have irrigated this today.  There is a small tear inside the ear canal and we will prescribe ofloxacin eardrops 2 drops twice daily for 5 days to prevent infection.  Would avoid using your earbud today.  May use over-the-counter Debrox to help with earwax buildup. Return to urgent care or PCP if symptoms worsen or fail to resolve.

## 2023-11-27 NOTE — ED Provider Notes (Signed)
 [facility]    CSN: [id number] Arrival date & time: 11/27/23  0802      History   Chief Complaint Chief Complaint  Patient presents with   URI    HPI Christopher Steele is a 63 y.o. male.   63 year old male who presents urgent care with complaints of significant sinus congestion, sinus drainage and recent upper respiratory infection.  The patient reports that about 10 days ago he caught a cold after working outside in the rain.  He originally had cough, congestion.  Most of his symptoms has resolved however he continues to have significant sinus pressure and congestion.  He has been using over-the-counter medications as well as Flonase .  He has used a nebulizing treatment as well.  He denies any fevers, chills, shortness of breath, chest pain, headache, ear pain, sore throat.  Of note on physical exam, there was a impacted cerumen on the left.  The patient did note that his wife has said he has been having a hard time hearing. He denies any ear pain.     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 Authorizing Provider  amoxicillin (AMOXIL) 875 MG tablet Take 1 tablet (875 mg total) by mouth 2 (two) times daily for 10 days. 11/27/23 12/07/23 Yes Kayce Chismar  A, PA-C  albuterol  (PROAIR  HFA) 108 (90 Base) MCG/ACT inhaler Inhale 2 puffs into the lungs every 6 (six) hours as needed for wheezing or shortness of breath. 02/28/21   Alexander Iba, PA  albuterol  (PROVENTIL ) (2.5 MG/3ML) 0.083% nebulizer solution USE 1 VIAL IN NEBULIZER EVERY 6 HOURS AS NEEDED FOR WHEEZING FOR SHORTNESS OF BREATH 10/22/21   Alexander Iba, PA  fluticasone  (FLONASE ) 50 MCG/ACT nasal spray Use 2 spray(s) in each nostril once daily 11/17/23   Anthon Kins, MD  ofloxacin (FLOXIN) 0.3 % OTIC solution Place 2 drops into the left ear 2 (two) times daily. 11/27/23  Yes Mychal Decarlo A, PA-C  Saline (SIMPLY SALINE) 0.9 % AERS Place 2 each into the nose as directed. Use nightly for sinus hygiene long-term.  Can also be used as many times daily as desired to assist with clearing congested sinuses. 06/02/22   Anthon Kins, MD  SYMBICORT  160-4.5 MCG/ACT inhaler Inhale 2 puffs into the lungs 2 (two) times daily. 02/28/21   Alexander Iba, PA    Family History Family History  Problem Relation Age of Onset   Asthma Mother    Hypertension Mother    Diabetes Mother    Heart disease Father    Heart disease Sister    Diabetes Sister    High Cholesterol Sister    Hypertension Sister    Colon cancer Neg Hx  Esophageal cancer Neg Hx    Rectal cancer Neg Hx    Stomach cancer Neg Hx    Prostate cancer Neg Hx     Social History Social History   Tobacco Use   Smoking status: Never   Smokeless tobacco: Never  Substance Use Topics   Alcohol use: Yes    Comment: rarely   Drug use: No     Allergies   Patient has no known allergies.   Review of Systems Review of Systems  Constitutional:  Negative for chills and fever.  HENT:  Positive for congestion, sinus pressure and sinus pain. Negative for ear pain and sore throat.   Eyes:  Negative for pain and visual disturbance.  Respiratory:  Negative for cough and shortness of breath.   Cardiovascular:  Negative for chest  pain and palpitations.  Gastrointestinal:  Negative for abdominal pain and vomiting.  Genitourinary:  Negative for dysuria and hematuria.  Musculoskeletal:  Negative for arthralgias and back pain.  Skin:  Negative for color change and rash.  Neurological:  Negative for seizures and syncope.  All other systems reviewed and are negative.    Physical Exam Triage Vital Signs ED Triage Vitals  Encounter Vitals Group     BP      Systolic BP Percentile      Diastolic BP Percentile      Pulse      Resp      Temp      Temp src      SpO2      Weight      Height      Head Circumference      Peak Flow      Pain Score      Pain Loc      Pain Education      Exclude from Growth Chart    No data found.  Updated Vital Signs BP (!) 165/96 (BP Location: Left Arm)   Pulse 67   Temp 97.9 F (36.6 C) (Oral)   Resp 18   SpO2 95%   Visual Acuity Right Eye Distance:   Left Eye Distance:   Bilateral Distance:    Right Eye Near:   Left Eye Near:    Bilateral Near:     Physical Exam Vitals and nursing note reviewed.  Constitutional:      General: He is not in acute distress.    Appearance: He is well-developed.  HENT:     Head: Normocephalic and atraumatic.     Right Ear: Tympanic membrane normal.     Left Ear: There is impacted cerumen.     Nose: Mucosal edema, congestion and rhinorrhea present.     Left Sinus: Maxillary sinus tenderness present.     Mouth/Throat:     Mouth: Mucous membranes are moist.  Eyes:     Conjunctiva/sclera: Conjunctivae normal.  Cardiovascular:     Rate and Rhythm: Normal rate and regular rhythm.     Heart sounds: No murmur heard. Pulmonary:     Effort: Pulmonary effort is normal. No respiratory distress.     Breath sounds: Normal breath sounds.  Abdominal:     Palpations: Abdomen is soft.     Tenderness: There is no abdominal tenderness.  Musculoskeletal:        General: No swelling.     Cervical back: Neck supple.  Skin:    General: Skin  is warm and dry.     Capillary Refill: Capillary refill takes less than 2  seconds.  Neurological:     Mental Status: He is alert.  Psychiatric:        Mood and Affect: Mood normal.      UC Treatments / Results  Labs (all labs ordered are listed, but only abnormal results are displayed) Labs Reviewed - No data to display  EKG   Radiology No results found.  Procedures Procedures (including critical care time)  Medications Ordered in UC Medications - No data to display  Initial Impression / Assessment and Plan / UC Course  I have reviewed the triage vital signs and the nursing notes.  Pertinent labs & imaging results that were available during my care of the patient were reviewed by me and considered in my medical decision making (see chart for details).     Acute non-recurrent maxillary sinusitis  Impacted cerumen of left ear   Symptoms and physical exam findings consistent maxillary sinusitis.  Given duration of symptoms we will treat with the following: Amoxicillin 875 mg twice daily for 10 days.   Continue Flonase  2 sprays each nostril once daily for nasal congestion. Also with impacted left ear canal, we have irrigated this today. There was an exceedingly large impacted cerumen present and there is a small tear inside the ear canal and we will prescribe ofloxacin eardrops 2 drops twice daily for 5 days to prevent infection.  Would avoid using earbud today.  May use over-the-counter Debrox to help with earwax buildup. Return to urgent care or PCP if symptoms worsen or fail to resolve.    Final Clinical Impressions(s) / UC Diagnoses   Final diagnoses:  Acute non-recurrent maxillary sinusitis  Impacted cerumen of left ear     Discharge Instructions      Symptoms and physical exam findings consistent maxillary sinusitis.  Given duration of symptoms we will treat with the following: Amoxicillin 875 mg twice daily for 10 days.  This is an antibiotic.  Take this  with food.  Continue Flonase  2 sprays each nostril once daily for nasal congestion. Also with impacted left ear canal, we have irrigated this today.  There is a small tear inside the ear canal and we will prescribe ofloxacin eardrops 2 drops twice daily for 5 days to prevent infection.  Would avoid using your earbud today.  May use over-the-counter Debrox to help with earwax buildup. Return to urgent care or PCP if symptoms worsen or fail to resolve.     ED Prescriptions     Medication Sig Dispense Auth. Provider   amoxicillin (AMOXIL) 875 MG tablet Take 1 tablet (875 mg total) by mouth 2 (two) times daily for 10 days. 20 tablet Makaia Rappa A, PA-C   ofloxacin (FLOXIN) 0.3 % OTIC solution Place 2 drops into the left ear 2 (two) times daily. 5 mL Kreg Pesa, [place]      PDMP not reviewed this encounter.   Kreg Pesa, [place] 11/27/23 [phone]

## 2024-03-20 ENCOUNTER — Other Ambulatory Visit: Payer: Self-pay | Admitting: Internal Medicine

## 2024-03-20 DIAGNOSIS — J320 Chronic maxillary sinusitis: Secondary | ICD-10-CM

## 2024-06-21 ENCOUNTER — Other Ambulatory Visit: Payer: Self-pay | Admitting: Physician Assistant

## 2024-06-21 NOTE — Telephone Encounter (Signed)
 Copied from CRM [id number]. Topic: Clinical - Medication Refill >> Jun 21, 2024  9:11 AM Wess RAMAN wrote: Medication: SYMBICORT  160-4.5 MCG/ACT inhaler   Has the patient contacted their pharmacy? Yes (Agent: If no, request that the patient contact the pharmacy for the refill. If patient does not wish to contact the pharmacy document the reason why and proceed with request.) (Agent: If yes, when and what did the pharmacy advise?)  This is the patient's preferred pharmacy:  [facility] [address] [place], [place] - [address] [place] [address] Phone: [phone] Fax: [phone]  Is this the correct pharmacy for this prescription? Yes If no, delete pharmacy and type the correct one.   Has the prescription been filled recently? Yes  Is the patient out of the medication? Yes  Has the patient been seen for an appointment in the last year OR does the patient have an upcoming appointment? Yes  Can we respond through MyChart? Yes  Agent: Please be advised that Rx refills may take up to 3 business days. We ask that you follow-up with your pharmacy.

## 2024-06-28 ENCOUNTER — Ambulatory Visit: Payer: Self-pay

## 2024-06-28 NOTE — Telephone Encounter (Signed)
 Noted pt scheduled 06/29/24

## 2024-06-28 NOTE — Telephone Encounter (Signed)
"   FYI Only or Action Required?: FYI only for provider: appointment scheduled on 06/29/24.  Patient was last seen in primary care on 06/02/2022 by Jesus Bernardino MATSU, MD.  Called Nurse Triage reporting Shortness of Breath.  Symptoms began several days ago.  Interventions attempted: Prescription medications: Albuterol  Neb .  Symptoms are: stable.  Triage Disposition: See PCP Within 2 Weeks  Patient/caregiver understands and will follow disposition?: Yes Reason for Disposition  [1] MILD longstanding difficulty breathing (e.g., minimal/no SOB at rest, SOB with walking, pulse < 100) AND [2] SAME as normal  Answer Assessment - Initial Assessment Questions Symbicort  inhaler prn, but patient ran out. Patient used Albuterol  Neb treatment with relief. Not currently experiencing SOB.   1. RESPIRATORY STATUS: Describe your breathing? (e.g., wheezing, shortness of breath, unable to speak, severe coughing)      Not currently, but experienced SOB with exertion a few days at work. Reports doing more physical activity at work than usual, noticed some wheezing.   2. ONSET: When did this breathing problem begin?      Few days ago  3. PATTERN Does the difficult breathing come and go, or has it been constant since it started?      With exertion  4. SEVERITY: How bad is your breathing? (e.g., mild, moderate, severe)      Mild at time of episode  5. RECURRENT SYMPTOM: Have you had difficulty breathing before? If Yes, ask: When was the last time? and What happened that time?      Yes, been awhile. Symbicort  inhaler has been out for a week but has not really needed to use it since using Flonase  has helped  6. CARDIAC HISTORY: Do you have any history of heart disease? (e.g., heart attack, angina, bypass surgery, angioplasty)      Denies  7. LUNG HISTORY: Do you have any history of lung disease?  (e.g., pulmonary embolus, asthma, emphysema)     Was told had Asthma, seen another doctor and  was told it was his sinuses, so patient is unsure  8. CAUSE: What do you think is causing the breathing problem?      Unsure  9. OTHER SYMPTOMS: Do you have any other symptoms? (e.g., chest pain, cough, dizziness, fever, runny nose)     Denies  10. O2 SATURATION MONITOR:  Do you use an oxygen saturation monitor (pulse oximeter) at home? If Yes, ask: What is your reading (oxygen level) today? What is your usual oxygen saturation reading? (e.g., 95%)       Unable to assess, does not have pulse ox.  Protocols used: Breathing Difficulty-A-AH  Copied from CRM [id number]. Topic: Clinical - Red Word Triage >> Jun 28, 2024  8:54 AM Pinkey ORN wrote: Red Word that prompted transfer to Nurse Triage: Shortness Of Breath >> Jun 28, 2024  8:56 AM Pinkey ORN wrote: Patient states since being out of his SYMBICORT  160-4.5 MCG/ACT inhaler, he's been short of breath. "

## 2024-06-29 ENCOUNTER — Telehealth: Payer: Self-pay | Admitting: Physician Assistant

## 2024-06-29 ENCOUNTER — Ambulatory Visit: Admitting: Family Medicine

## 2024-06-29 ENCOUNTER — Encounter: Payer: Self-pay | Admitting: Family Medicine

## 2024-06-29 VITALS — BP 136/86 | HR 80 | Temp 97.3°F | Ht 71.0 in | Wt 312.5 lb

## 2024-06-29 DIAGNOSIS — J4530 Mild persistent asthma, uncomplicated: Secondary | ICD-10-CM | POA: Diagnosis not present

## 2024-06-29 MED ORDER — SYMBICORT 160-4.5 MCG/ACT IN AERO: 2.0000 | INHALATION_SPRAY | Freq: Two times a day (BID) | RESPIRATORY_TRACT | 1 refills | Status: AC

## 2024-06-29 NOTE — Progress Notes (Signed)
 "  Subjective:     Patient ID: Christopher Steele, male    DOB: Aug 20, 1960, 64 y.o.   MRN: [id number]  Chief Complaint  Patient presents with   Shortness of Breath    Pt is SOB he has not had his inhaler for a while (per Pt)    Discussed the use of AI scribe software for clinical note transcription with the patient, who gave verbal consent to proceed.  History of Present Illness Christopher Steele is a 64 year old male with asthma who presents with breathing difficulties exacerbated by cold weather. Last seen 2 yrs ago  He has a history of asthma managed with Symbicort , initially used twice daily but now on an as-needed basis due to symptom improvement. Cold weather significantly exacerbates his breathing difficulties, particularly when outdoors, leading to increased symptoms during winter months. He experiences a runny nose and changes in breathing associated with cold exposure.  He was previously informed that his symptoms might be related to sinus issues rather than asthma and was prescribed a nasal spray, which has been helpful. Despite this, he experiences seasonal exacerbations of respiratory symptoms, particularly from September through January.  He has not used Symbicort  regularly and recently ran out, leading to worsening symptoms. He uses albuterol  via a nebulizer at home for acute symptoms but is currently not using nebulizer solution.  No recent surgeries. He has a history of throat surgeries. He rarely drinks alcohol, has never smoked, works at [employer], and has two children and one grandchild.  He has a morning cough that resolves with water intake.    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 HEAD EYES EARS NOSE THROAT: Normocephalic, atraumatic, conjunctiva not injected, sclera nonicteric. CARDIAC: Regular rate and rhythm, S1 S2 present, no murmur, NECK: Supple, no thyromegaly, no nodes, no carotid bruits. LUNGS: Clear to auscultation bilaterally, no wheezes. EXTREMITIES: No edema. MUSCULOSKELETAL: No gross abnormalities. NEUROLOGICAL: Alert and oriented x3, cranial nerves II through XII intact. PSYCHIATRIC: Normal mood, good eye contact.       Assessment & Plan:  Mild persistent asthma without complication -     Symbicort ; Inhale 2 puffs into the lungs 2 (two) times daily.  Dispense: 10 g; Refill: 1  Assessment and Plan Assessment & Plan Mild persistent asthma   Cold weather exacerbates his asthma, causing increased coughing and runny nose. Symptoms improve with Symbicort  and Flonase . He has run out of Symbicort , leading to increased symptoms, and uses a nebulizer with albuterol  as needed-but rare. Prefers Symbicort  for its effectiveness and duration. Prescribe Symbicort  for regular use during winter to prevent exacerbations. Continue Flonase  nasal spray as needed. Provided a note for one week to stay indoors due to cold weather exacerbations. I don't think needs long  term work restrictions as taking symbicort  regularly may work well  Hypertension  - history of Blood pressure is elevated at 136/86 mmHg. He has not been on antihypertensive medication recently and acknowledges the need to manage blood pressure to prevent complications. Encourage follow-up with primary care provider for monitoring  General Health Maintenance   He has not had a physical examination or blood work since 2019 and is due for routine health maintenance and monitoring of chronic conditions. Schedule an appointment with primary care provider for a physical examination and routine blood work.     Return for annual physical-w/Sam soon.  Jenkins CHRISTELLA Carrel, MD     [1]  Current Outpatient Medications:    albuterol  (PROAIR  HFA) 108 (90 Base) MCG/ACT inhaler, Inhale 2 puffs into the lungs every 6 (six) hours as needed for wheezing or shortness of breath., Disp: 18 g, Rfl: 3   albuterol  (PROVENTIL ) (2.5 MG/3ML) 0.083% nebulizer solution, USE 1 VIAL IN NEBULIZER EVERY 6 HOURS AS NEEDED FOR WHEEZING FOR SHORTNESS OF BREATH, Disp: 75 mL, Rfl: 0   fluticasone  (FLONASE ) 50 MCG/ACT nasal spray, Use 2 spray(s) in each nostril once daily, Disp: 16 g, Rfl: 0   Saline (SIMPLY SALINE) 0.9 % AERS, Place 2 each into the nose as directed. Use nightly for sinus hygiene long-term.  Can also be used as many times daily as desired to assist with clearing congested sinuses., Disp: 127 mL, Rfl: 11   SYMBICORT  160-4.5 MCG/ACT inhaler, Inhale 2 puffs into the lungs 2 (two) times daily., Disp: 10 g, Rfl: 1 [2] No Known Allergies  "

## 2024-06-29 NOTE — Telephone Encounter (Signed)
 LVM For patient to schedule their yearly physical per provider's instruction

## 2024-06-29 NOTE — Patient Instructions (Signed)
 It was very nice to see you today!  Winter months-taking symbicort  more regularly   PLEASE NOTE:  If you had any lab tests please let us  know if you have not heard back within a few days. You may see your results on MyChart before we have a chance to review them but we will give you a call once they are reviewed by us . If we ordered any referrals today, please let us  know if you have not heard from their office within the next week.   Please try these tips to maintain a healthy lifestyle:  Eat most of your calories during the day when you are active. Eliminate processed foods including packaged sweets (pies, cakes, cookies), reduce intake of potatoes, white bread, white pasta, and white rice. Look for whole grain options, oat flour or almond flour.  Each meal should contain half fruits/vegetables, one quarter protein, and one quarter carbs (no bigger than a computer mouse).  Cut down on sweet beverages. This includes juice, soda, and sweet tea. Also watch fruit intake, though this is a healthier sweet option, it still contains natural sugar! Limit to 3 servings daily.  Drink at least 1 glass of water with each meal and aim for at least 8 glasses per day  Exercise at least 150 minutes every week.

## 2024-07-07 ENCOUNTER — Ambulatory Visit: Admitting: Physician Assistant

## 2024-08-25 ENCOUNTER — Encounter: Admitting: Physician Assistant
